# Patient Record
Sex: Male | Born: 1975 | Race: Black or African American | Hispanic: No | Marital: Single | Smoking: Never smoker
Health system: Southern US, Community
[De-identification: ages and names within clinical notes are randomized; demographics above are authoritative.]

## PROBLEM LIST (undated history)

## (undated) DIAGNOSIS — K219 Gastro-esophageal reflux disease without esophagitis: Secondary | ICD-10-CM

## (undated) DIAGNOSIS — R51 Headache: Secondary | ICD-10-CM

## (undated) DIAGNOSIS — B019 Varicella without complication: Secondary | ICD-10-CM

## (undated) DIAGNOSIS — IMO0001 Reserved for inherently not codable concepts without codable children: Secondary | ICD-10-CM

## (undated) DIAGNOSIS — R519 Headache, unspecified: Secondary | ICD-10-CM

## (undated) DIAGNOSIS — R030 Elevated blood-pressure reading, without diagnosis of hypertension: Secondary | ICD-10-CM

## (undated) DIAGNOSIS — J302 Other seasonal allergic rhinitis: Secondary | ICD-10-CM

## (undated) DIAGNOSIS — K921 Melena: Secondary | ICD-10-CM

## (undated) HISTORY — DX: Varicella without complication: B01.9

## (undated) HISTORY — DX: Headache: R51

## (undated) HISTORY — DX: Headache, unspecified: R51.9

## (undated) HISTORY — DX: Other seasonal allergic rhinitis: J30.2

## (undated) HISTORY — DX: Gastro-esophageal reflux disease without esophagitis: K21.9

## (undated) HISTORY — PX: OTHER SURGICAL HISTORY: SHX169

## (undated) HISTORY — DX: Reserved for inherently not codable concepts without codable children: IMO0001

## (undated) HISTORY — DX: Melena: K92.1

## (undated) HISTORY — DX: Elevated blood-pressure reading, without diagnosis of hypertension: R03.0

---

## 2012-12-07 ENCOUNTER — Encounter (HOSPITAL_COMMUNITY): Payer: Self-pay | Admitting: Emergency Medicine

## 2012-12-07 ENCOUNTER — Emergency Department (HOSPITAL_COMMUNITY)
Admission: EM | Admit: 2012-12-07 | Discharge: 2012-12-07 | Disposition: A | Discharge status: Home / Self Care | Payer: BC Managed Care – PPO | Attending: Emergency Medicine | Admitting: Emergency Medicine

## 2012-12-07 ENCOUNTER — Emergency Department (HOSPITAL_COMMUNITY): Payer: BC Managed Care – PPO

## 2012-12-07 DIAGNOSIS — M25559 Pain in unspecified hip: Secondary | ICD-10-CM

## 2012-12-07 MED ORDER — MELOXICAM 15 MG PO TABS: 15.0000 mg | ORAL_TABLET | Freq: Every day | ORAL | Status: DC

## 2012-12-07 MED ORDER — MELOXICAM 7.5 MG PO TABS: 15.0000 mg | ORAL_TABLET | Freq: Every day | ORAL | Status: DC

## 2012-12-07 NOTE — ED Notes (Signed)
Pt presenting to ed with c/o right side hip pain x 1 month pt states he is unsure of injury.

## 2012-12-07 NOTE — ED Notes (Signed)
Patient transported to X-ray 

## 2012-12-10 NOTE — ED Provider Notes (Signed)
History     CSN: [id number]  Arrival date & time 12/07/12  2725   First MD Initiated Contact with Patient 12/07/12 [phone]      Chief Complaint  Patient presents with  . Hip Pain    (Consider location/radiation/quality/duration/timing/severity/associated sxs/prior treatment) HPI Patient with c/o right hip pai x 1 month.  Pain is worse when lying flat or with excessive physical activity ( weigh training, running, playnig basketball.) pain is in front hip and worse with hip extension. Denies abdominal/groin or testicular pain.  Denies dysuria, hematuria or frequency.  History reviewed. No pertinent past medical history.  History reviewed. No pertinent past surgical history.  No family history on file.  History  Substance Use Topics  . Smoking status: Never Smoker   . Smokeless tobacco: Not on file  . Alcohol Use: Yes     Comment: occassionally      Review of Systems Ten systems reviewed and are negative for acute change, except as noted in the HPI.   Allergies  Review of patient's allergies indicates no known allergies.  Home Medications   Current Outpatient Rx  Name  Route  Sig  Dispense  Refill  . MELOXICAM 15 MG PO TABS   Oral   Take 1 tablet (15 mg total) by mouth daily.   10 tablet   0     BP 123/70  Pulse 60  Temp 98.7 F (37.1 C) (Oral)  Resp 17  SpO2 99%  Physical Exam Physical Exam  Nursing note and vitals reviewed. Constitutional: He appears well-developed and well-nourished. No distress.  HENT:  Head: Normocephalic and atraumatic.  Eyes: Conjunctivae normal are normal. No scleral icterus.  Neck: Normal range of motion. Neck supple.  Cardiovascular: Normal rate, regular rhythm and normal heart sounds.   Pulmonary/Chest: Effort normal and breath sounds normal. No respiratory distress.  Abdominal: Soft. There is no tenderness.  Musculoskeletal: He exhibits no edema. A right hip exam was performed. SKIN: intact SWELLING: none WARMTH: no  warmth TENDERNESS: none ROM: full STRENGTH: normal GAIT: normal CREPITUS: no LEG LENGTH DISCREPANCY: none NEUROLOGICAL EXAM: normal VASCULAR EXAM: normal Neurological: He is alert.  Skin: Skin is warm and dry. He is not diaphoretic.  Psychiatric: His behavior is normal.    ED Course  Procedures (including critical care time)  Labs Reviewed - No data to display No results found.   1. Hip pain       MDM  Patient with possible tendinitis of the hip. NO AVN  Or other radiographic abnormlitiy. Patient will be discharged with anitinflammatory and ortho follow up.        Arthor Captain, PA-C 12/10/12 1851

## 2012-12-12 NOTE — ED Provider Notes (Signed)
Medical screening examination/treatment/procedure(s) were performed by non-physician practitioner and as supervising physician I was immediately available for consultation/collaboration.   Nelia Shi, MD 12/12/12 1037

## 2014-07-21 ENCOUNTER — Encounter (HOSPITAL_COMMUNITY): Payer: Self-pay | Admitting: Emergency Medicine

## 2014-07-21 ENCOUNTER — Emergency Department (HOSPITAL_COMMUNITY): Payer: BC Managed Care – PPO

## 2014-07-21 ENCOUNTER — Emergency Department (HOSPITAL_COMMUNITY)
Admission: EM | Admit: 2014-07-21 | Discharge: 2014-07-21 | Disposition: A | Discharge status: Home / Self Care | Payer: BC Managed Care – PPO | Attending: Emergency Medicine | Admitting: Emergency Medicine

## 2014-07-21 DIAGNOSIS — R1013 Epigastric pain: Secondary | ICD-10-CM | POA: Insufficient documentation

## 2014-07-21 DIAGNOSIS — R110 Nausea: Secondary | ICD-10-CM | POA: Diagnosis not present

## 2014-07-21 DIAGNOSIS — R079 Chest pain, unspecified: Secondary | ICD-10-CM | POA: Diagnosis present

## 2014-07-21 DIAGNOSIS — R0602 Shortness of breath: Secondary | ICD-10-CM | POA: Insufficient documentation

## 2014-07-21 LAB — CBC
HEMATOCRIT: 46.4 % (ref 39.0–52.0)
HEMOGLOBIN: 16 g/dL (ref 13.0–17.0)
MCH: 29.9 pg (ref 26.0–34.0)
MCHC: 34.5 g/dL (ref 30.0–36.0)
MCV: 86.6 fL (ref 78.0–100.0)
Platelets: 217 10*3/uL (ref 150–400)
RBC: 5.36 MIL/uL (ref 4.22–5.81)
RDW: 13.3 % (ref 11.5–15.5)
WBC: 6.4 10*3/uL (ref 4.0–10.5)

## 2014-07-21 LAB — BASIC METABOLIC PANEL
Anion gap: 14 (ref 5–15)
BUN: 12 mg/dL (ref 6–23)
CO2: 25 meq/L (ref 19–32)
Calcium: 9.2 mg/dL (ref 8.4–10.5)
Chloride: 101 mEq/L (ref 96–112)
Creatinine, Ser: 0.93 mg/dL (ref 0.50–1.35)
GFR calc Af Amer: >90 mL/min (ref >90)
GLUCOSE: 113 mg/dL — AB (ref 70–99)
POTASSIUM: 4 meq/L (ref 3.7–5.3)
Sodium: 140 mEq/L (ref 137–147)

## 2014-07-21 LAB — I-STAT TROPONIN, ED
TROPONIN I, POC: 0.03 ng/mL (ref 0.00–0.08)
TROPONIN I, POC: 0.05 ng/mL (ref 0.00–0.08)

## 2014-07-21 LAB — PRO B NATRIURETIC PEPTIDE: Pro B Natriuretic peptide (BNP): 13.9 pg/mL (ref 0–125)

## 2014-07-21 MED ORDER — PANTOPRAZOLE SODIUM 20 MG PO TBEC: 40.0000 mg | DELAYED_RELEASE_TABLET | Freq: Every day | ORAL | Status: DC

## 2014-07-21 MED ORDER — GI COCKTAIL ~~LOC~~
30.0000 mL | Freq: Once | ORAL | Status: AC
  Administered 2014-07-21: 30 mL via ORAL
  Filled 2014-07-21: qty 30

## 2014-07-21 MED ORDER — SUCRALFATE 1 G PO TABS: 1.0000 g | ORAL_TABLET | Freq: Four times a day (QID) | ORAL | Status: DC

## 2014-07-21 NOTE — ED Notes (Signed)
Pt. reports pain across chest onset this evening with mild SOB and nausea . No diaphoresis .

## 2014-07-21 NOTE — ED Notes (Signed)
Family at bedside. 

## 2014-07-21 NOTE — ED Provider Notes (Signed)
CSN: [id number]     Arrival date & time 07/21/14  0127 History   First MD Initiated Contact with Patient 07/21/14 0321     Chief Complaint  Patient presents with  . Chest Pain     (Consider location/radiation/quality/duration/timing/severity/associated sxs/prior Treatment) HPI 38 yo male presents to the ER from home with complaint of chest pain waking from sleep around 1 am.  Pain is sharp, extends as a band across his chest.  No diaphoresis, mild nausea and mild sob.  No prior h/o same.  Pain in epigastrium as well.  Denies any medical history, family history. History reviewed. No pertinent past medical history. History reviewed. No pertinent past surgical history. No family history on file. History  Substance Use Topics  . Smoking status: Never Smoker   . Smokeless tobacco: Not on file  . Alcohol Use: Yes     Comment: occassionally    Review of Systems  All other systems reviewed and are negative.     Allergies  Review of patient's allergies indicates no known allergies.  Home Medications   Prior to Admission medications   Not on File   BP 124/81  Pulse 69  Temp(Src) 97.8 F (36.6 C) (Oral)  Resp 20  SpO2 96% Physical Exam  Nursing note and vitals reviewed. Constitutional: He is oriented to person, place, and time. He appears well-developed and well-nourished. No distress.  HENT:  Head: Normocephalic and atraumatic.  Nose: Nose normal.  Mouth/Throat: Oropharynx is clear and moist.  Eyes: Conjunctivae and EOM are normal. Pupils are equal, round, and reactive to light.  Neck: Normal range of motion. Neck supple. No JVD present. No tracheal deviation present. No thyromegaly present.  Cardiovascular: Normal rate, regular rhythm, normal heart sounds and intact distal pulses.  Exam reveals no gallop and no friction rub.   No murmur heard. Pulmonary/Chest: Effort normal and breath sounds normal. No stridor. No respiratory distress. He has no wheezes. He has no rales.  He exhibits no tenderness.  Abdominal: Soft. Bowel sounds are normal. He exhibits no distension and no mass. There is tenderness (mild epigastric tenderness). There is no rebound and no guarding.  Musculoskeletal: Normal range of motion. He exhibits no edema and no tenderness.  Lymphadenopathy:    He has no cervical adenopathy.  Neurological: He is alert and oriented to person, place, and time. He displays normal reflexes. He exhibits normal muscle tone. Coordination normal.  Skin: Skin is warm and dry. No rash noted. No erythema. No pallor.  Psychiatric: He has a normal mood and affect. His behavior is normal. Judgment and thought content normal.    ED Course  Procedures (including critical care time) Labs Review Labs Reviewed  BASIC METABOLIC PANEL - Abnormal; Notable for the following:    Glucose, Bld 113 (*)    All other components within normal limits  CBC  PRO B NATRIURETIC PEPTIDE  I-STAT TROPOININ, ED  Rosezena Sensor, ED    Imaging Review Dg Chest 2 View  07/21/2014   CLINICAL DATA:  Chest pain.  EXAM: CHEST  2 VIEW  COMPARISON:  None.  FINDINGS: The lungs are mildly hypoexpanded but appear grossly clear. There is no evidence of focal opacification, pleural effusion or pneumothorax.  The heart is normal in size; the mediastinal contour is within normal limits. No acute osseous abnormalities are seen.  IMPRESSION: Lungs mildly hypoexpanded but grossly clear.   Electronically Signed   By: Roanna Raider M.D.   On: 07/21/2014 02:57  EKG Interpretation   Date/Time:  Thursday July 21 2014 01:38:40 EDT Ventricular Rate:  72 PR Interval:  166 QRS Duration: 92 QT Interval:  370 QTC Calculation: 405 R Axis:   56 Text Interpretation:  Normal sinus rhythm Normal ECG Confirmed by Alizeh Madril   MD, Quinlin Conant ([id number]) on 07/21/2014 3:33:51 AM      MDM   Final diagnoses:  Chest pain with low risk for cardiac etiology    38 yo male with chest pain.  Heart score is 0.  Pain does  not seem pulmonary or cardiac in nature.  Possible GERD, will give GI cocktail.  Delta trop pending.    Olivia Mackie, MD 07/21/14 [phone]

## 2014-07-21 NOTE — Discharge Instructions (Signed)
Chest Pain Observation It is often hard to give a specific diagnosis for the cause of chest pain. Among other possibilities your symptoms might be caused by inadequate oxygen delivery to your heart (angina). Angina that is not treated or evaluated can lead to a heart attack (myocardial infarction) or death. Blood tests, electrocardiograms, and X-rays may have been done to help determine a possible cause of your chest pain. After evaluation and observation, your health care provider has determined that it is unlikely your pain was caused by an unstable condition that requires hospitalization. However, a full evaluation of your pain may need to be completed, with additional diagnostic testing as directed. It is very important to keep your follow-up appointments. Not keeping your follow-up appointments could result in permanent heart damage, disability, or death. If there is any problem keeping your follow-up appointments, you must call your health care provider. HOME CARE INSTRUCTIONS  Due to the slight chance that your pain could be angina, it is important to follow your health care provider's treatment plan and also maintain a healthy lifestyle:  Maintain or work toward achieving a healthy weight.  Stay physically active and exercise regularly.  Decrease your salt intake.  Eat a balanced, healthy diet. Talk to a dietitian to learn about heart-healthy foods.  Increase your fiber intake by including whole grains, vegetables, fruits, and nuts in your diet.  Avoid situations that cause stress, anger, or depression.  Take medicines as advised by your health care provider. Report any side effects to your health care provider. Do not stop medicines or adjust the dosages on your own.  Quit smoking. Do not use nicotine patches or gum until you check with your health care provider.  Keep your blood pressure, blood sugar, and cholesterol levels within normal limits.  Limit alcohol intake to no more than  1 drink per day for women who are not pregnant and 2 drinks per day for men.  Do not abuse drugs. SEEK IMMEDIATE MEDICAL CARE IF: You have severe chest pain or pressure which may include symptoms such as:  You feel pain or pressure in your arms, neck, jaw, or back.  You have severe back or abdominal pain, feel sick to your stomach (nauseous), or throw up (vomit).  You are sweating profusely.  You are having a fast or irregular heartbeat.  You feel short of breath while at rest.  You notice increasing shortness of breath during rest, sleep, or with activity.  You have chest pain that does not get better after rest or after taking your usual medicine.  You wake from sleep with chest pain.  You are unable to sleep because you cannot breathe.  You develop a frequent cough or you are coughing up blood.  You feel dizzy, faint, or experience extreme fatigue.  You develop severe weakness, dizziness, fainting, or chills. Any of these symptoms may represent a serious problem that is an emergency. Do not wait to see if the symptoms will go away. Call your local emergency services (911 in the U.S.). Do not drive yourself to the hospital. MAKE SURE YOU:  Understand these instructions.  Will watch your condition.  Will get help right away if you are not doing well or get worse. Document Released: 11/23/2010 Document Revised: 10/26/2013 Document Reviewed: 04/22/2013 [facility] Patient Information 2015 [place], [place]. This information is not intended to replace advice given to you by your health care provider. Make sure you discuss any questions you have with your health care provider.  Pain of Unknown Etiology (Pain Without a Known Cause) You have come to your caregiver because of pain. Pain can occur in any part of the body. Often there is not a definite cause. If your laboratory (blood or urine) work was normal and X-rays or other studies were normal, your caregiver may treat you without  knowing the cause of the pain. An example of this is the headache. Most headaches are diagnosed by taking a history. This means your caregiver asks you questions about your headaches. Your caregiver determines a treatment based on your answers. Usually testing done for headaches is normal. Often testing is not done unless there is no response to medications. Regardless of where your pain is located today, you can be given medications to make you comfortable. If no physical cause of pain can be found, most cases of pain will gradually leave as suddenly as they came.  If you have a painful condition and no reason can be found for the pain, it is important that you follow up with your caregiver. If the pain becomes worse or does not go away, it may be necessary to repeat tests and look further for a possible cause.  Only take over-the-counter or prescription medicines for pain, discomfort, or fever as directed by your caregiver.  For the protection of your privacy, test results cannot be given over the phone. Make sure you receive the results of your test. Ask how these results are to be obtained if you have not been informed. It is your responsibility to obtain your test results.  You may continue all activities unless the activities cause more pain. When the pain lessens, it is important to gradually resume normal activities. Resume activities by beginning slowly and gradually increasing the intensity and duration of the activities or exercise. During periods of severe pain, bed rest may be helpful. Lie or sit in any position that is comfortable.  Ice used for acute (sudden) conditions may be effective. Use a large plastic bag filled with ice and wrapped in a towel. This may provide pain relief.  See your caregiver for continued problems. Your caregiver can help or refer you for exercises or physical therapy if necessary. If you were given medications for your condition, do not drive, operate machinery or  power tools, or sign legal documents for 24 hours. Do not drink alcohol, take sleeping pills, or take other medications that may interfere with treatment. See your caregiver immediately if you have pain that is becoming worse and not relieved by medications. Document Released: 07/16/2001 Document Revised: 08/11/2013 Document Reviewed: 10/21/2005 [facility] Patient Information 2015 [place], [place]. This information is not intended to replace advice given to you by your health care provider. Make sure you discuss any questions you have with your health care provider.   Emergency Department Resource Guide 1) Find a Doctor and Pay Out of Pocket Although you won't have to find out who is covered by your insurance plan, it is a good idea to ask around and get recommendations. You will then need to call the office and see if the doctor you have chosen will accept you as a new patient and what types of options they offer for patients who are self-pay. Some doctors offer discounts or will set up payment plans for their patients who do not have insurance, but you will need to ask so you aren't surprised when you get to your appointment.  2) Contact Your Local Health Department Not all health departments have doctors  that can see patients for sick visits, but many do, so it is worth a call to see if yours does. If you don't know where your local health department is, you can check in your phone book. The CDC also has a tool to help you locate your state's health department, and many state websites also have listings of all of their local health departments.  3) Find a Walk-in Clinic If your illness is not likely to be very severe or complicated, you may want to try a walk in clinic. These are popping up all over the country in pharmacies, drugstores, and shopping centers. They're usually staffed by nurse practitioners or physician assistants that have been trained to treat common illnesses and complaints. They're  usually fairly quick and inexpensive. However, if you have serious medical issues or chronic medical problems, these are probably not your best option.  No Primary Care Doctor: - Call Health Connect at  [phone] - they can help you locate a primary care doctor that  accepts your insurance, provides certain services, etc. - Physician Referral Service- [phone]  Chronic Pain Problems: Organization         Address  Phone   Notes  [facility]  [phone] Patients need to be referred by their primary care doctor.   Medication Assistance: Organization         Address  Phone   Notes  [facility] [address] [place] [place], [place] [address] [phone] --Must be a resident of [facility] -- Must have NO insurance coverage whatsoever (no Medicaid/ Medicare, etc.) -- The pt. MUST have a primary care doctor that directs their care regularly and follows them in the community   [facility]  [phone]   [employer]  [phone]    Agencies that provide inexpensive medical care: Organization         Address  Phone   Notes  [facility]  [phone]   [facility]    [phone]   [facility] [address] [place], [place] [address] [phone]   [facility] [address] [place] [place] [phone]   Planned Parenthood    [phone]   [facility]    [phone]   [facility]  [address], [place] Phone:  [phone], Fax:  [phone] Hours of Operation:  9 am - 6 pm, M-F.  Also accepts Medicaid/Medicare and self-pay.  [facility]  [address], [place] Phone: [phone], Fax: [phone]. Hours of Operation:  8:30 am - 5:30 pm, M-F.  Also accepts Medicaid and self-pay.  [facility] [place] [address], [place] Phone:  [phone]   [facility] [address] Natasha Bence [place], [place] [phone], Ext. [phone] Mondays & Thursdays: 7-9 AM.  First 15 patients are seen on a first come, first serve basis.    Medicaid-accepting [facility] Providers:  Organization         Address  Phone   Notes  [facility] [address], [place] [phone] Also accepts self-pay patients.  [facility] [address] Laurell Josephs [place], [place]  [phone]   [facility] [address], [place] [phone]   [facility] [address]  [place], [place] [phone]   Renaye Rakers [address], [place]   [phone] Only accepts [place] Access [place] patients after they have their name applied to their card.   Self-Pay (no insurance) in [facility]:  Organization         Address  Phone   Notes  Sickle Cell Patients, [facility] [address] [place] [address] [place], [place] [phone]   [facility] [address] [place], [place] [phone]   [facility] [place]  [address], [place] [phone]   [facility]/Dr. Osei-Bonsu  [address] [place] or [address], [place] [phone] Phone number for both [place] and [place] locations is the same.  [facility] [address], [place] [phone]   [facility] [address] [place] or [address] [phone]   [facility] [address], [place] [phone], phone; [phone], fax Sees patients 1st and 3rd Saturday of every month.  Must not qualify for public or private insurance (i.e. Medicaid, Medicare, [place] Health Choice, Veterans' Benefits)  Household income should be no more than 200% of the poverty level The clinic cannot treat  you if you are pregnant or think you are pregnant  Sexually transmitted diseases are not treated at the clinic.    Dental Care: Organization         Address  Phone  Notes  [facility] [address] [place], [place] [phone] Accepts children up to age 27 who are enrolled in [place] or [place] Health Choice; pregnant women with a Medicaid card; and children who have applied for Medicaid or [place] Health Choice, but were declined, whose parents can pay a reduced fee at time of service.  [facility]  [address], [place] [phone] Accepts children up to age 97 who are enrolled in [place] or [place] Health Choice; pregnant women with a Medicaid card; and children who have applied for Medicaid or  Health Choice, but were declined, whose parents can pay a reduced fee at time of service.  [facility]  [address] [place], [place] [phone] Patients are seen by appointment only. Walk-ins are not accepted. [facility] will see patients 70 years of age and older. Monday - Tuesday (8am-5pm) Most Wednesdays (8:30-5pm) $30 per visit, cash only  [facility] Adult Dental Access PRO[facility]  [address], [facility] [phone] Patients are seen by appointment only. Walk-ins are not accepted. [facility] will see patients 60 years of age and older. One Wednesday Evening (Monthly: Volunteer Based).  $30 per visit, cash only  [employer]  [phone] for adults; Children under age 66, call Graduate Pediatric Dentistry at [phone]. Children aged 33-14, please call [phone] to request a pediatric application.  Dental services are provided in all areas of dental care including fillings, crowns and bridges, complete and partial dentures, implants, gum treatment, root canals, and extractions. Preventive care is also provided. Treatment  is provided to both adults and children. Patients are selected via a lottery and there is often a waiting list.   [facility] [address], [place]  [phone] [email or web address]   [facility] [address] [place], [place] [phone], Ext. [phone] Second and Fourth Thursday of each month, opens at 6:30 AM; Clinic ends at 9 AM.  Patients are seen on a first-come first-served basis, and a limited number are seen during each clinic.   [facility]  [address] Ether Griffins [place], [place] [phone]   Eligibility Requirements You must have lived in [place], [place], or [place] counties for at least the last three months.   You cannot be eligible for state or federal sponsored [place], including [employer], [place], or [employer].   You generally cannot be eligible for healthcare insurance through your employer.    How to apply: Eligibility screenings are held every Tuesday and Wednesday afternoon from 1:00 pm until 4:00 pm. You do not need an appointment for the interview!  [facility] [address] [place], [place] [phone]   [facility]  [phone]   [facility]  [phone]   [facility] Health Department  [phone]    Behavioral Health Resources in the Community: Intensive Outpatient Programs Organization         Address  Phone  Notes  [facility] [address] [place], [place] [phone]   [facility] Outpatient [address] [place] [address] [place], [place] [phone]   [facility] [address], [place], [place]  [phone]   [facility] Mental Health [address]  [place], [place] [phone] or [phone]   Substance Abuse Resources Organization         Address  Phone  Notes  [facility]  [phone]   [facility]  [phone]   The  [place]  [phone]   Floydene Flock  [phone]   Residential & Outpatient Substance Abuse Program  [phone]   Psychological Services Organization         Address  Phone  Notes  [facility] Behavioral Health  [phone]   [facility]  [phone]   [facility] Mental Health [address], [place] [phone] or [phone]    Mobile Crisis Teams Organization         Address  Phone  Notes  [facility], Mobile Crisis Care Unit  [phone]   [facility]  [address] [place], [place] [phone]   Doristine Locks [address] [place] [phone]    Self-Help/Support Groups Organization         Address  Phone             Notes  Mental Health Assoc. of  - variety of support groups  336- I7437963 Call for more information  Narcotics Anonymous (NA), [facility] [address], [employer] [place]  2 meetings at this location   Statistician         Address  Phone  Notes  [facility] [address],    [place]  [phone]   [facility]  [address] [place] [address], [place], [place] [phone]   [facility] [address] [place], [place] [phone] Admissions: 8am-3pm M-F  [facility] [address]    [place], [place] [phone]   [facility] [address] Starling Manns [place], [place] [phone]   [facility] [address],  [place], [place] [phone]   [facility] - Intensive Outpatient [address] Laurell Joseph[address], [place], [place] [phone]  [facility] ([facility]iction [facility] [address] [place]  [place], [place] [phone] or [phone]   [facility] ([facility]) [address], [place], [place] [phone] Accepts Medicaid  [facility] [address]  [place] [phone] Substance Abuse/Addiction Treatment     [facility] Organization         Address  Phone  Notes  [facility]  [phone]   Angie Fava, PhD [address] [place], [place]   [phone] or [phone]   [facility]   [address] [place], [place] [phone]   [facility] [address], [place], [place] [phone] Insurance/Medicaid/sponsorship through [facility] and Families [address]                                    [place], [place] [phone] Therapy/tele-psych/case  [facility] [address], [place] [phone]    Dr. Lolly Mustache  [phone]   [facility] [place]  [employer] [facility] Dept. 1) [address] [place] 2) [address] [place] 3)  [address], [place] [phone]  [phone]   [facility] Child Abuse Hotline [phone] or [phone] (After Hours)

## 2014-08-03 ENCOUNTER — Encounter: Payer: Self-pay | Admitting: Family Medicine

## 2014-08-03 ENCOUNTER — Ambulatory Visit (INDEPENDENT_AMBULATORY_CARE_PROVIDER_SITE_OTHER): Payer: BC Managed Care – PPO | Admitting: Family Medicine

## 2014-08-03 VITALS — BP 114/82 | HR 60 | Temp 98.2°F | Ht 69.0 in | Wt 225.0 lb

## 2014-08-03 DIAGNOSIS — R519 Headache, unspecified: Secondary | ICD-10-CM | POA: Insufficient documentation

## 2014-08-03 DIAGNOSIS — B019 Varicella without complication: Secondary | ICD-10-CM | POA: Insufficient documentation

## 2014-08-03 DIAGNOSIS — K921 Melena: Secondary | ICD-10-CM | POA: Insufficient documentation

## 2014-08-03 DIAGNOSIS — Z202 Contact with and (suspected) exposure to infections with a predominantly sexual mode of transmission: Secondary | ICD-10-CM

## 2014-08-03 DIAGNOSIS — R51 Headache: Secondary | ICD-10-CM | POA: Insufficient documentation

## 2014-08-03 DIAGNOSIS — K219 Gastro-esophageal reflux disease without esophagitis: Secondary | ICD-10-CM

## 2014-08-03 HISTORY — DX: Varicella without complication: B01.9

## 2014-08-03 NOTE — Progress Notes (Signed)
Maxwell ConchStephen Hunter, MD Phone: [phone]  Subjective:  Patient presents today to establish care as new patient. Chief complaint-noted.   Patient formerly cared for in [place] and got yearly physicals. He has a few concerns including chest pain on 9/13 that woke him from sleep. He had normal workup in ED and has not had recurrent symptoms-heart score 0 and thought likely due to GERD. No shortness of breath, nausea, vomiting, diaphoresis. Patient thinks he has hemorrhoids as he has occasional rectal bleeding with straining. Never been evaluated. Did have exposure to genital warts many years ago but no history STD since that time with normal testing. Patient is interested in losing weight and was around 185 in college. We discussed goals for weight and tactics to get there.   The following were reviewed and entered/updated in epic: Past Medical History  Diagnosis Date  . Blood in stool   . Headache     every other month. ? concussion related from HS football  . GERD (gastroesophageal reflux disease)     1x chest pain  . Seasonal allergies     as child  . Elevated blood pressure     once when in ED per patient  . Chicken pox 08/03/2014   Patient Active Problem List   Diagnosis Date Noted  . Blood in stool 08/03/2014    Priority: High  . GERD (gastroesophageal reflux disease) 08/03/2014    Priority: Medium  . Headache(784.0) 08/03/2014    Priority: Low  . Exposure to STD 08/03/2014    Priority: Low   Past Surgical History  Procedure Laterality Date  . None      Family History  Problem Relation Age of Onset  . Alcoholism Father   . Arthritis      grandparents  . Breast cancer Mother   . Hyperlipidemia Father   . Hypertension Father   . Sudden death Brother     40s-very active guy-not clear cause    Medications- reviewed and updated. No current medications.   Allergies-reviewed and updated No Known Allergies  History   Social History  . Marital Status: Single   Spouse Name: N/A    Number of Children: N/A  . Years of Education: N/A   Social History Main Topics  . Smoking status: Never Smoker   . Smokeless tobacco: None  . Alcohol Use: 2.5 oz/week    5 drink(s) per week     Comment: occassionally  . Drug Use: No  . Sexual Activity: None   Other Topics Concern  . None   Social History Narrative   Single. 1 daughter Maxwell Kerr who is 1513 and lives in [place] as of 2013. Drives down to see her weekly.    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 Oropharynx normal. TM normal.  Eyes: no scleral icterus, sclera and conjunctiva normal Neck: no thyromegaly, no lymphadenopathy CV: RRR no murmurs rubs or gallops Lungs: CTAB no crackles, wheeze, rhonchi Abdomen: soft/nontender/nondistended/normal bowel sounds. No rebound or guarding.  Ext: no edema, 2+ PT pulses,  Skin: warm, dry,  no rashes Neuro: 5/5 strength upper and lower extremities, 2+ reflexes  Assessment/Plan:  Blood in stool Likely hemorrhoid related. Present for several years. Consider rectal, hemoccult, anoscopy? at next visit. Consider GI referral though no history of colon cancer in family.   GERD (gastroesophageal reflux disease) 1x episode provoking ED visit. Not clear if truly GERD but did seem to respond to GI cocktail. Could consider long term PPI trial if recurrent. No stress testing at this time   Exposure to STD Patient desires repeat STD testing at physical. Will plan on rectal and GU exam at that time.   obtain records for further information.

## 2014-08-03 NOTE — Assessment & Plan Note (Signed)
1x episode provoking ED visit. Not clear if truly GERD but did seem to respond to GI cocktail. Could consider long term PPI trial if recurrent. No stress testing at this time

## 2014-08-03 NOTE — Patient Instructions (Addendum)
Things look great today!   Need to get records-stop at front desk  See me back in December or January for physical (will do rectal exam and genital exam at that time)  Target weight 190-200 at least  My 5 to Fitness!  5: fruits and vegetables per day (work on 9 per day if you are at 5) 4: exercise 4-5 times per week for at least 30 minutes (walking counts!) 3: meals per day (don't skip breakfast!) 2: habits to quit -smoking -excess alcohol use (men >2 beer/day; women >1beer/day) 1: sweet per day (2 cookies, 1 small cup of ice cream, 12 oz soda)  These are general tips for healthy living. Try to start with 1 or 2 habit TODAY and make it a part of your life for several months.   Once you have 1 or 2 habits down for several months, try to begin working on your next healthy habit. With every single step you take, you will be leading a healthier lifestyle!

## 2014-08-03 NOTE — Assessment & Plan Note (Signed)
Likely hemorrhoid related. Present for several years. Consider rectal, hemoccult, anoscopy? at next visit. Consider GI referral though no history of colon cancer in family.

## 2014-08-03 NOTE — Assessment & Plan Note (Signed)
Patient desires repeat STD testing at physical. Will plan on rectal and GU exam at that time.

## 2015-01-03 ENCOUNTER — Other Ambulatory Visit (INDEPENDENT_AMBULATORY_CARE_PROVIDER_SITE_OTHER): Payer: BLUE CROSS/BLUE SHIELD

## 2015-01-03 DIAGNOSIS — Z113 Encounter for screening for infections with a predominantly sexual mode of transmission: Secondary | ICD-10-CM

## 2015-01-03 DIAGNOSIS — Z0000 Encounter for general adult medical examination without abnormal findings: Secondary | ICD-10-CM

## 2015-01-03 LAB — POCT URINALYSIS DIPSTICK
Bilirubin, UA: NEGATIVE
Glucose, UA: NEGATIVE
Ketones, UA: NEGATIVE
Leukocytes, UA: NEGATIVE
NITRITE UA: NEGATIVE
PH UA: 5.5
Protein, UA: NEGATIVE
RBC UA: NEGATIVE
Spec Grav, UA: 1.025
UROBILINOGEN UA: 1

## 2015-01-03 LAB — CBC WITH DIFFERENTIAL/PLATELET
BASOS ABS: 0 10*3/uL (ref 0.0–0.1)
BASOS PCT: 0.1 % (ref 0.0–3.0)
EOS PCT: 0.7 % (ref 0.0–5.0)
Eosinophils Absolute: 0.1 10*3/uL (ref 0.0–0.7)
HEMATOCRIT: 47.1 % (ref 39.0–52.0)
Hemoglobin: 15.9 g/dL (ref 13.0–17.0)
LYMPHS ABS: 1.4 10*3/uL (ref 0.7–4.0)
LYMPHS PCT: 15.9 % (ref 12.0–46.0)
MCHC: 33.7 g/dL (ref 30.0–36.0)
MCV: 88.7 fl (ref 78.0–100.0)
MONOS PCT: 9 % (ref 3.0–12.0)
Monocytes Absolute: 0.8 10*3/uL (ref 0.1–1.0)
NEUTROS ABS: 6.5 10*3/uL (ref 1.4–7.7)
NEUTROS PCT: 74.3 % (ref 43.0–77.0)
Platelets: 230 10*3/uL (ref 150.0–400.0)
RBC: 5.31 Mil/uL (ref 4.22–5.81)
RDW: 14.1 % (ref 11.5–15.5)
WBC: 8.7 10*3/uL (ref 4.0–10.5)

## 2015-01-03 LAB — LIPID PANEL
CHOLESTEROL: 248 mg/dL — AB (ref 0–200)
HDL: 48 mg/dL (ref >39.00)
LDL CALC: 168 mg/dL — AB (ref 0–99)
NonHDL: 200
Total CHOL/HDL Ratio: 5
Triglycerides: 162 mg/dL — ABNORMAL HIGH (ref 0.0–149.0)
VLDL: 32.4 mg/dL (ref 0.0–40.0)

## 2015-01-03 LAB — BASIC METABOLIC PANEL
BUN: 13 mg/dL (ref 6–23)
CALCIUM: 9.6 mg/dL (ref 8.4–10.5)
CHLORIDE: 106 meq/L (ref 96–112)
CO2: 28 mEq/L (ref 19–32)
Creatinine, Ser: 0.97 mg/dL (ref 0.40–1.50)
GFR: 112.24 mL/min (ref >60.00)
GLUCOSE: 100 mg/dL — AB (ref 70–99)
Potassium: 4.1 mEq/L (ref 3.5–5.1)
SODIUM: 141 meq/L (ref 135–145)

## 2015-01-03 LAB — HEPATIC FUNCTION PANEL
ALT: 18 U/L (ref 0–53)
AST: 17 U/L (ref 0–37)
Albumin: 4.4 g/dL (ref 3.5–5.2)
Alkaline Phosphatase: 94 U/L (ref 39–117)
BILIRUBIN TOTAL: 1.1 mg/dL (ref 0.2–1.2)
Bilirubin, Direct: 0.2 mg/dL (ref 0.0–0.3)
Total Protein: 7.4 g/dL (ref 6.0–8.3)

## 2015-01-03 LAB — RPR

## 2015-01-03 LAB — TSH: TSH: 2.78 u[IU]/mL (ref 0.35–4.50)

## 2015-01-04 LAB — HIV ANTIBODY (ROUTINE TESTING W REFLEX): HIV: NONREACTIVE

## 2015-01-10 ENCOUNTER — Ambulatory Visit (INDEPENDENT_AMBULATORY_CARE_PROVIDER_SITE_OTHER): Payer: BLUE CROSS/BLUE SHIELD | Admitting: Family Medicine

## 2015-01-10 ENCOUNTER — Encounter: Payer: Self-pay | Admitting: Family Medicine

## 2015-01-10 VITALS — BP 110/60 | HR 76 | Temp 98.5°F | Ht 69.0 in | Wt 228.0 lb

## 2015-01-10 DIAGNOSIS — IMO0001 Reserved for inherently not codable concepts without codable children: Secondary | ICD-10-CM

## 2015-01-10 DIAGNOSIS — E785 Hyperlipidemia, unspecified: Secondary | ICD-10-CM

## 2015-01-10 DIAGNOSIS — Z23 Encounter for immunization: Secondary | ICD-10-CM

## 2015-01-10 DIAGNOSIS — K219 Gastro-esophageal reflux disease without esophagitis: Secondary | ICD-10-CM

## 2015-01-10 DIAGNOSIS — Z0000 Encounter for general adult medical examination without abnormal findings: Secondary | ICD-10-CM

## 2015-01-10 DIAGNOSIS — K921 Melena: Secondary | ICD-10-CM

## 2015-01-10 DIAGNOSIS — R739 Hyperglycemia, unspecified: Secondary | ICD-10-CM

## 2015-01-10 LAB — POC HEMOCCULT BLD/STL (OFFICE/1-CARD/DIAGNOSTIC)
Fecal Occult Blood, POC: NEGATIVE
OCCULT BLOOD DATE: 30816

## 2015-01-10 NOTE — Assessment & Plan Note (Signed)
No recurrence of pain that led to ED visit and relieved by GI cocktail.

## 2015-01-10 NOTE — Assessment & Plan Note (Signed)
Discussed weight goal <200. Fasting CBG elevated placing at risk for diabetes.

## 2015-01-10 NOTE — Assessment & Plan Note (Addendum)
Likely hemorrhoid related. At CPE hemocult negative and rectal exam unremarkable-visualization without hemorrhoids. Declines anoscopy. Discussed stool cards at least every other year if issue persists. Consider colonoscopy-fortunately no family history (does not rule out but reassuring)

## 2015-01-10 NOTE — Patient Instructions (Signed)
Tdap today  Goal weight 190-200. I like your exercise plan. Tips to help below  No blood in stool detected today. Keep an eye on this and if it becomes more regular let me know  My 5 to Fitness!  5: fruits and vegetables per day (work on 9 per day if you are at 5) 4: exercise 4-5 times per week for at least 30 minutes (walking counts!) 3: meals per day (don't skip breakfast!) 2: habits to quit -smoking -excess alcohol use (men >2 beer/day; women >1beer/day) 1: sweet per day (2 cookies, 1 small cup of ice cream, 12 oz soda)  These are general tips for healthy living. Try to start with 1 or 2 habit TODAY and make it a part of your life for several months.   Once you have 1 or 2 habits down for several months, try to begin working on your next healthy habit. With every single step you take, you will be leading a healthier lifestyle!

## 2015-01-10 NOTE — Progress Notes (Signed)
Maxwell Conch, MD Phone: [phone]  Subjective:  Patient presents today for their annual physical. Chief complaint-noted.   Continues to have bleeding with straining perhaps every 3-6 months. Just on the toilet paper.  Target weigh 190-200 but weight up 3 lbs. Discussed ways to improve this Starting to exercise more. Basketball 2 days a week. Mornings exercise a few days a week.   Eats reasonably well.   ROS- full  review of systems was completed and negative except for: blood on toilet paper as above, occasional headaches, he has not had any further chest pain issues as seen in ED for.   The following were reviewed and entered/updated in epic: Past Medical History  Diagnosis Date  . Blood in stool   . Headache     every other month. ? concussion related from HS football  . GERD (gastroesophageal reflux disease)     1x chest pain  . Seasonal allergies     as child  . Elevated blood pressure     once when in ED per patient  . Chicken pox 08/03/2014   Patient Active Problem List   Diagnosis Date Noted  . Blood in stool 08/03/2014    Priority: High  . Hyperlipidemia 01/10/2015    Priority: Medium  . Hyperglycemia 01/10/2015    Priority: Medium  . Headache(784.0) 08/03/2014    Priority: Low  . GERD (gastroesophageal reflux disease) 08/03/2014    Priority: Low  . Exposure to STD 08/03/2014    Priority: Low   Past Surgical History  Procedure Laterality Date  . None      Family History  Problem Relation Age of Onset  . Alcoholism Father   . Arthritis      grandparents  . Breast cancer Mother   . Hyperlipidemia Father   . Hypertension Father   . Sudden death Brother     40s-very active guy-not clear cause    Medications- none  Allergies-reviewed and updated No Known Allergies  History   Social History  . Marital Status: Single    Spouse Name: N/A  . Number of Children: N/A  . Years of Education: N/A   Social History Main Topics  . Smoking status:  Never Smoker   . Smokeless tobacco: Not on file  . Alcohol Use: 2.5 oz/week    5 drink(s) per week     Comment: occassionally  . Drug Use: No  . Sexual Activity: Not on file   Other Topics Concern  . None   Social History Narrative   Single. 1 daughter Charlsie Quest who is 20 and lives in [place] as of 2013. Drives down to see her weekly.    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 Oropharynx normal Neck: no thyromegaly CV: RRR no murmurs rubs or gallops Lungs: CTAB no crackles, wheeze, rhonchi Abdomen: soft/nontender/nondistended/normal bowel sounds. No rebound or guarding.  GU: hypopigmented raised/scarred area (area of previous cryo for genital wart). Has 1-78mm lesion/papule on mid portion of penis shaft that is flesh colored (has been present since before any sexual activity) Rectal: smooth non enlarged prostate, did not palpate obvious internal hemorrhoids (  Declines anoscopy) Ext: no edema, 2+ PT pulses Skin: warm, dry, no rash Neuro: grossly normal, moves all extremities, PERRLA   hemocult negative  Assessment/Plan:  39 y.o. male presenting for annual physical.  Health Maintenance counseling: 1. Anticipatory guidance: Patient counseled regarding regular dental exams, wearing seatbelts  2. Risk factor reduction:  Advised patient of need for regular exercise and diet rich and fruits and vegetables to reduce risk of heart attack and stroke.  3. Immunizations/screenings/ancillary studies-Tdap today 4. Reviewed bloodwork showing at risk for diabetes and elevated lipids though would not advise statin at age.  Blood in stool Likely hemorrhoid  related. At CPE hemocult negative and rectal exam unremarkable-visualization without hemorrhoids. Declines anoscopy. Discussed stool cards at least every other year if issue persists. Consider colonoscopy-fortunately no family history (does not rule out but reassuring)   Hyperlipidemia No planned primary prevention before age 39 with statin. Needs diet/exercise changes. Lab Results  Component Value Date   CHOL 248* 01/03/2015   HDL 48.00 01/03/2015   LDLCALC 168* 01/03/2015   TRIG 162.0* 01/03/2015   CHOLHDL 5 01/03/2015      GERD (gastroesophageal reflux disease) No recurrence of pain that led to ED visit and relieved by GI cocktail.    Hyperglycemia Discussed weight goal <200. Fasting CBG elevated placing at risk for diabetes.    1 year follow up.   Orders Placed This Encounter  Procedures  . Tdap vaccine greater than or equal to 7yo IM   Results for orders placed or performed in visit on 01/10/15 (from the past 24 hour(s))  POC Hemoccult Bld/Stl (1-Cd Office Dx)     Status: None   Collection Time: 01/10/15 10:54 AM  Result Value Ref Range   Card #1 Date [id number]    Fecal Occult Blood, POC Negative

## 2015-01-10 NOTE — Assessment & Plan Note (Signed)
No planned primary prevention before age 39 with statin. Needs diet/exercise changes. Lab Results  Component Value Date   CHOL 248* 01/03/2015   HDL 48.00 01/03/2015   LDLCALC 168* 01/03/2015   TRIG 162.0* 01/03/2015   CHOLHDL 5 01/03/2015

## 2015-02-01 IMAGING — CR DG CHEST 2V
2 series · 2 of 2 positions shown · non-contrast
Comparison: None.

CLINICAL DATA: Chest pain.

EXAM:
CHEST  2 VIEW

[w chest pa]
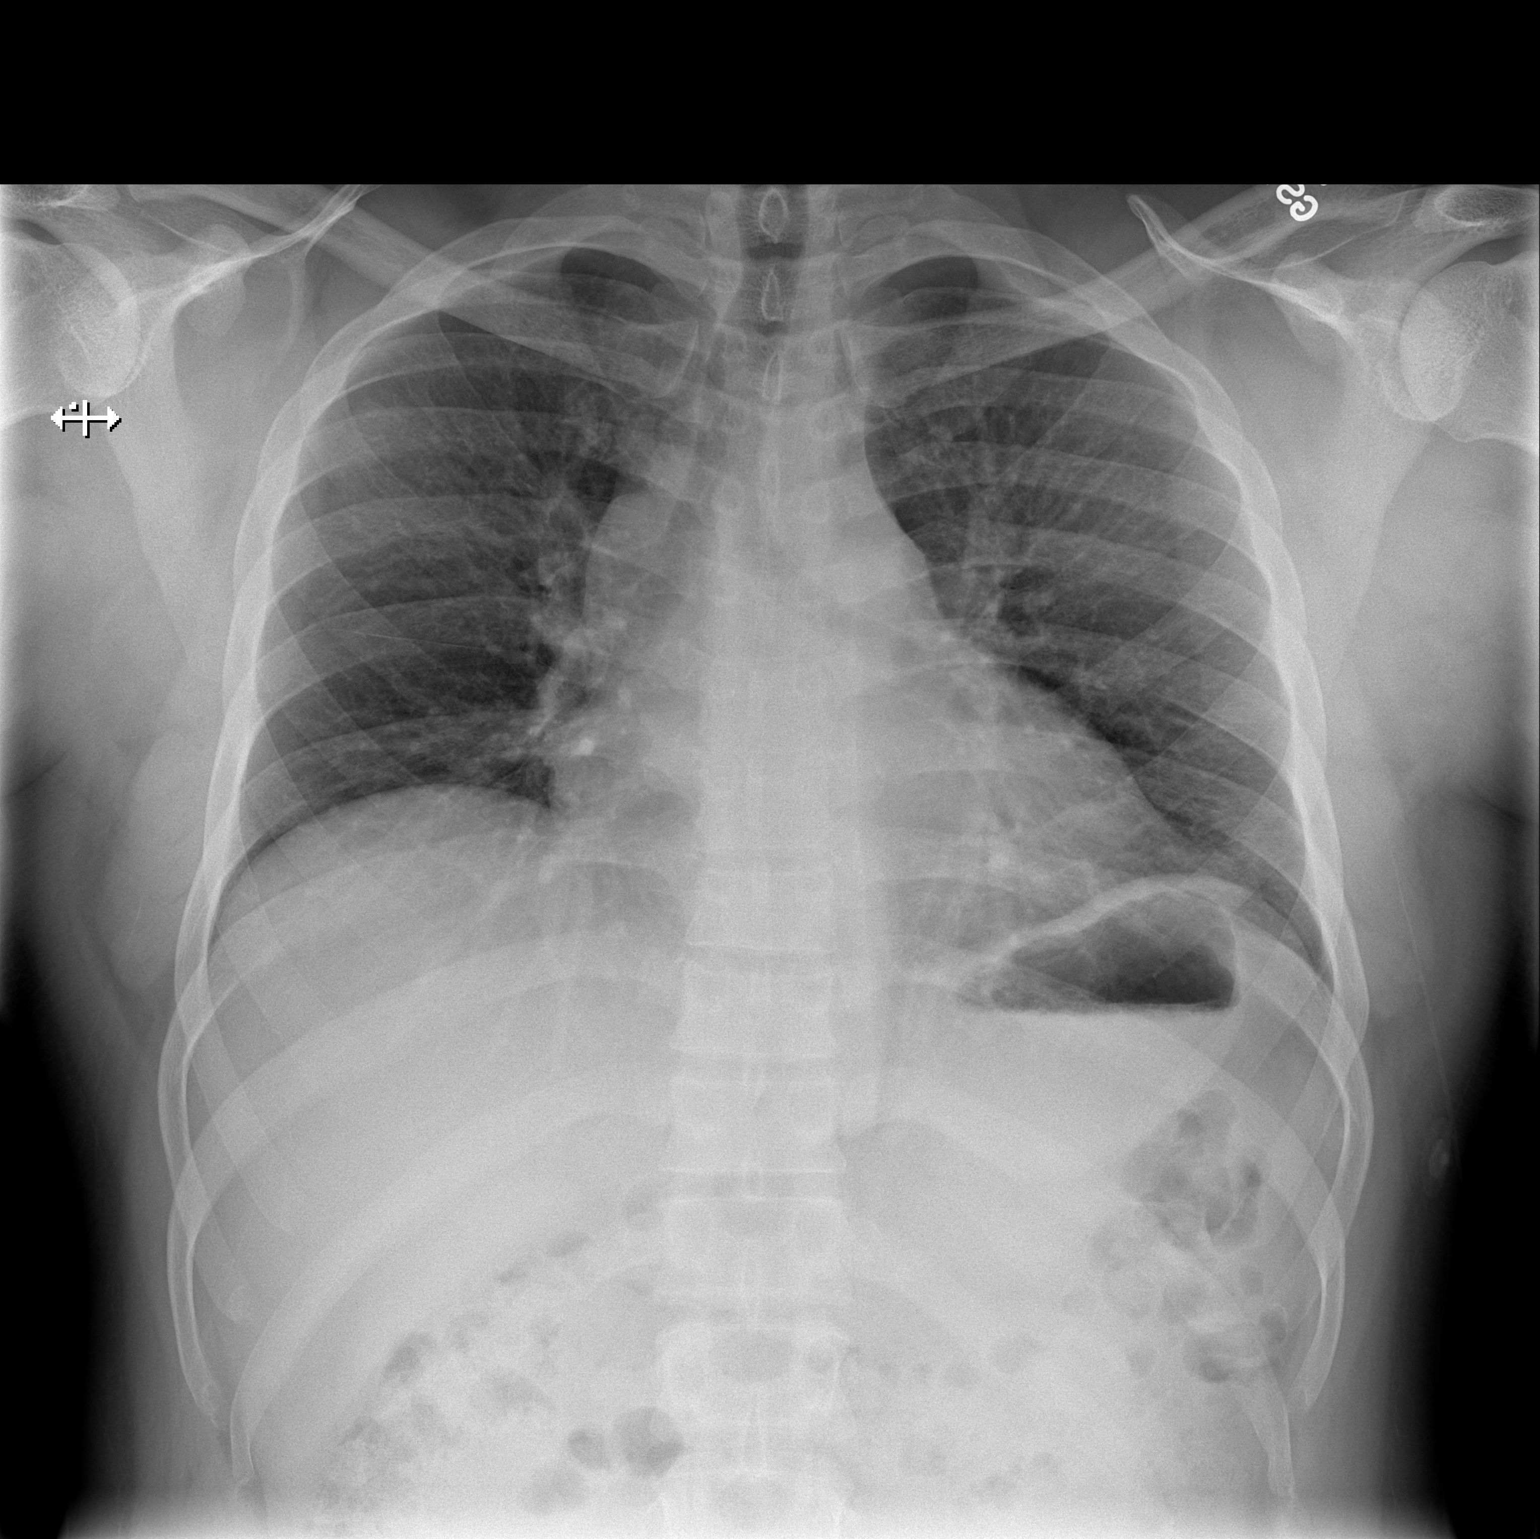

[w chest lat]
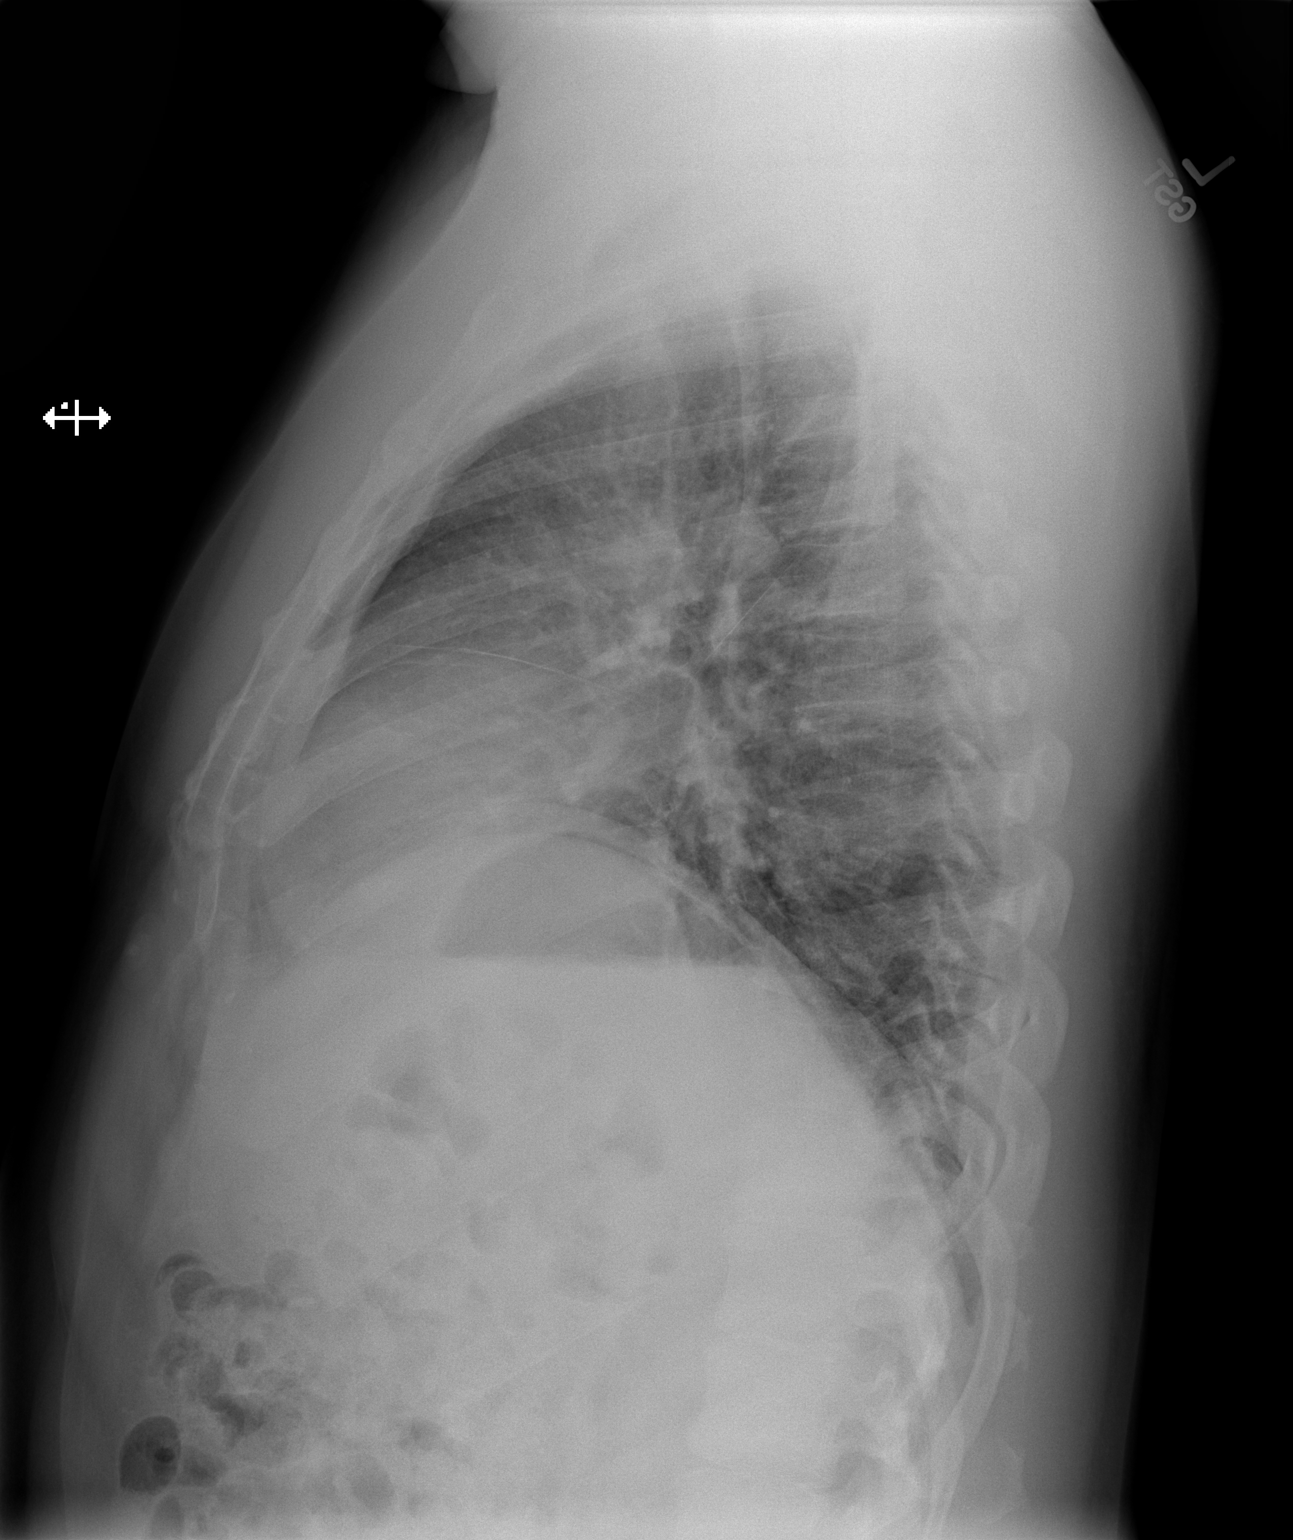

[2 of 2 positions shown; findings below may reference images not displayed]

FINDINGS: The lungs are mildly hypoexpanded but appear grossly clear. There is
no evidence of focal opacification, pleural effusion or
pneumothorax.

The heart is normal in size; the mediastinal contour is within
normal limits. No acute osseous abnormalities are seen.
IMPRESSION: Lungs mildly hypoexpanded but grossly clear.

## 2015-04-04 ENCOUNTER — Encounter: Payer: Self-pay | Admitting: Family Medicine

## 2015-04-04 ENCOUNTER — Ambulatory Visit (INDEPENDENT_AMBULATORY_CARE_PROVIDER_SITE_OTHER): Payer: BLUE CROSS/BLUE SHIELD | Admitting: Family Medicine

## 2015-04-04 VITALS — BP 92/60 | HR 68 | Temp 97.5°F | Wt 237.0 lb

## 2015-04-04 DIAGNOSIS — B354 Tinea corporis: Secondary | ICD-10-CM

## 2015-04-04 DIAGNOSIS — R21 Rash and other nonspecific skin eruption: Secondary | ICD-10-CM

## 2015-04-04 MED ORDER — TRIAMCINOLONE ACETONIDE 0.1 % EX CREA: 1.0000 "application " | TOPICAL_CREAM | Freq: Two times a day (BID) | CUTANEOUS | Status: DC

## 2015-04-04 MED ORDER — CLOTRIMAZOLE 1 % EX CREA: 1.0000 "application " | TOPICAL_CREAM | Freq: Two times a day (BID) | CUTANEOUS | Status: DC

## 2015-04-04 NOTE — Patient Instructions (Addendum)
Rash  Could be due to a contact  But on second look- there are at least 5 lesions that have a raised border and are circular in appearance  Clotrimazole twice a day for a minimum a week but up to 3 weeks. If does not resolve by 3 weeks, give us a call. Also if were to continue to worsen after a week call us.   Do not pick up the triamcinolone

## 2015-04-04 NOTE — Progress Notes (Signed)
Tana ConchStephen Jewell Haught, MD  Subjective:  Maxwell SladeKarlos Kerr is a 39 y.o. year old very pleasant male patient who presents with:  Rash about a month. Itchy bumps on both forearms, maybe on the back. Spots originally came and went and now they tend to persist. Thought could be heat related. Has tried cortisone cream- helps slightly with the itch. Only new exposure is being outdoors for softball for daughter. Wears short sleeves at work, not on upper arms. Tried a new detergent-Tide and usually used gain. No new soaps, fabric softeners. Has started C4 supplement for working out- 2-3 weeks ago, rash present before that.   ROS-not ill appearing, no fever/chills. No new medications. Not immunocompromised. No mucus membrane involvement.   Past Medical History- hyperglycemia, hyperlipidemia  Medications- reviewed and updated, none  Objective: BP 92/60 mmHg  Pulse 68  Temp(Src) 97.5 F (36.4 C)  Wt 237 lb (107.502 kg) Gen: NAD, resting comfortably CV: RRR no murmurs rubs or gallops Lungs: CTAB no crackles, wheeze, rhonchi Ext: no edema Skin: warm, dry, on bilateral arms has 5-10 lesions. At least 5 of these lesions in total have a circular or oval appearance with a raised border, none bigger than 4 x 4 cm. Other lesions appear like papules with smallest 1 x 1 cm and most in circular or oval appearance-some with excoriation. Slight erythematous base beneath all lesions.   Assessment/Plan:  Rash likely tinea corporis On first view, concerned for contact dermatitis, but when I reentered the room, and patient had changed positions the lesions had appearance more consistent with several tinea corporis spots-raised edge, circular shape with cleared central area (at least 1/3 of lesions). As such, changed course to treat with clotrimazole twice a day for 1-3 weeks. Unclear exposure. Discussed derm referral if does not clear within 3 weeks or worsening within a week.   Meds ordered this encounter  . clotrimazole  (LOTRIMIN) 1 % cream    Sig: Apply 1 application topically 2 (two) times daily. For 1-3 weeks.    Dispense:  60 g    Refill:  1

## 2016-11-05 ENCOUNTER — Ambulatory Visit (INDEPENDENT_AMBULATORY_CARE_PROVIDER_SITE_OTHER): Payer: BLUE CROSS/BLUE SHIELD | Admitting: Family Medicine

## 2016-11-05 ENCOUNTER — Encounter: Payer: Self-pay | Admitting: Family Medicine

## 2016-11-05 VITALS — BP 120/78 | HR 73 | Temp 98.7°F | Ht 69.0 in | Wt 224.8 lb

## 2016-11-05 DIAGNOSIS — M791 Myalgia, unspecified site: Secondary | ICD-10-CM

## 2016-11-05 DIAGNOSIS — R509 Fever, unspecified: Secondary | ICD-10-CM | POA: Diagnosis not present

## 2016-11-05 NOTE — Progress Notes (Addendum)
PCP: Maxwell ConchStephen Shail Urbas, MD  Subjective:  Maxwell Kerr is a 41 y.o. year old very pleasant male patient who presents with flu like symptoms including fever,body aches. No flu shot this year and does not desire.  -other symptoms: some cough, congestion, headache -started: last Thursday but came on gradual -inside 48 hour treatment window if needed for tamiflu: no -high risk condition (children <5, adults >65, chronic pulmonary or cardiac condition, immunosuppression, pregnancy, nursing home resident, morbid obesity) : no -symptoms are improving -previous treatments: acetaminophen - possible sick contacts around holidays; specifically influenza: no  ROS-denies SOB, NVD, sinus or dental pain  Pertinent Past Medical History-  Patient Active Problem List   Diagnosis Date Noted  . Blood in stool 08/03/2014    Priority: High  . Hyperlipidemia 01/10/2015    Priority: Medium  . Hyperglycemia 01/10/2015    Priority: Medium  . Headache(784.0) 08/03/2014    Priority: Low  . GERD (gastroesophageal reflux disease) 08/03/2014    Priority: Low  . Exposure to STD 08/03/2014    Priority: Low    Medications- reviewed  No current outpatient prescriptions on file.   No current facility-administered medications for this visit.     Objective: BP 120/78 (BP Location: Left Arm, Patient Position: Sitting, Cuff Size: Large)   Pulse 73   Temp 98.7 F (37.1 C) (Oral)   Ht 5\' 9"  (1.753 m)   Wt 224 lb 12.8 oz (102 kg)   SpO2 97%   BMI 33.20 kg/m  Gen: NAD, appears fatigued HEENT: Turbinates erythematous, TM normal, pharynx mildly erythematous with no tonsilar exudate or edema, no sinus tenderness CV: RRR no murmurs rubs or gallops Lungs: CTAB no crackles, wheeze, rhonchi Abdomen: soft/nontender/nondistended/normal bowel sounds. Ext: no edema Skin: warm, dry, no rash  Assessment/Plan:  Flu-like illness with fever/body aches History and exam today are suggestive of viral process. Patients  influenza test was not done.  Pretest probability of influenza was moderate. Given day 6 and fact that patient is improving even if test positive we discussed would not likely treat.  Symptomatic treatment with: rest, hydration- encouraged to stay out of work at least through rest of the day. He was reassured by the fact no pneumonia or bronchitis on exam which was his main concern.   Finally, we reviewed reasons to return to care including if symptoms worsen or persist or new concerns arise.  Maxwell ConchStephen Virginio Isidore, MD

## 2016-11-05 NOTE — Patient Instructions (Signed)
No obvious pneumonia or bronchitis. If you were to have fever again or shortness of breath or symptoms continue for another week- see us again to recheck.

## 2016-11-05 NOTE — Progress Notes (Signed)
Pre visit review using our clinic review tool, if applicable. No additional management support is needed unless otherwise documented below in the visit note. 

## 2019-04-20 ENCOUNTER — Encounter: Payer: Self-pay | Admitting: Family Medicine

## 2019-04-20 ENCOUNTER — Ambulatory Visit: Payer: Self-pay | Admitting: Family Medicine

## 2019-04-20 ENCOUNTER — Ambulatory Visit (INDEPENDENT_AMBULATORY_CARE_PROVIDER_SITE_OTHER): Payer: BC Managed Care – PPO | Admitting: Family Medicine

## 2019-04-20 DIAGNOSIS — Z20822 Contact with and (suspected) exposure to covid-19: Secondary | ICD-10-CM

## 2019-04-20 DIAGNOSIS — Z20828 Contact with and (suspected) exposure to other viral communicable diseases: Secondary | ICD-10-CM | POA: Diagnosis not present

## 2019-04-20 NOTE — Progress Notes (Signed)
    Chief Complaint:  Maxwell Kerr is a 43 y.o. male who presents today for a virtual office visit with a chief complaint of [place] exposure.   Assessment/Plan:  COVID19 Exposure Not currently having any symptoms.  Given that he after has not had any symptoms after 1 week of his last known contact, does not need testing at this point.  Advised him to be on the lookout for signs of infection including cough, chest pain, shortness of breath, fever, and body aches.  Discussed reasons to return to care.    Subjective:  HPI:  COVID 19 exposure Patient was on a golfing trip with a group of friends a week ago. Today found out that one of the friends he was golfing with recently tested for [place].  The person who tested positive for COVID was staying in a different hotel the note he stated.  He has not had any symptoms over the past week.  His last day of contact was over a week ago.  No cough or fevers.  No chest pain or shortness of breath.  ROS: Per HPI  PMH: He reports that he has never smoked. He has never used smokeless tobacco. He reports current alcohol use of about 5.0 standard drinks of alcohol per week. He reports that he does not use drugs.      Objective/Observations  Physical Exam: Gen: NAD, resting comfortably Pulm: Normal work of breathing Neuro: Grossly normal, moves all extremities Psych: Normal affect and thought content  Virtual Visit via Video   I connected with Maxwell Kerr on 04/20/19 at 10:40 AM EDT by a video enabled telemedicine application and verified that I am speaking with the correct person using two identifiers. I discussed the limitations of evaluation and management by telemedicine and the availability of in person appointments. The patient expressed understanding and agreed to proceed.   Patient location: Home Provider location: [place] participating in the virtual visit: Myself and Patient     Time Spent: I spent >15  minutes face-to-face with the patient, with more than half spent on counseling for management plan for his COVID-19 exposure.   Algis Greenhouse. Jerline Pain, MD 04/20/2019 10:57 AM

## 2019-04-20 NOTE — Telephone Encounter (Signed)
Pt. Reports he was on a golfing trip with friends and one of them has tested positive for COVID 19. Pt. Does not have symptoms, but would like a virtual visit. Warm transfer to [facility].  Answer Assessment - Initial Assessment Questions 1. CLOSE CONTACT: "Who is the person with the confirmed or suspected COVID-19 infection that you were exposed to?"     Friend tested positive 2. PLACE of CONTACT: "Where were you when you were exposed to COVID-19?" (e.g., home, school, medical waiting room; which city?)     Last time 1 week 3. TYPE of CONTACT: "How much contact was there?" (e.g., sitting next to, live in same house, work in same office, same building)     Vacation together 4. DURATION of CONTACT: "How long were you in contact with the COVID-19 patient?" (e.g., a few seconds, passed by person, a few minutes, live with the patient)     Several days 5. DATE of CONTACT: "When did you have contact with a COVID-19 patient?" (e.g., how many days ago)     Last week 6. TRAVEL: "Have you traveled out of the country recently?" If so, "When and where?"     * Also ask about out-of-state travel, since the CDC has identified some high-risk cities for community spread in the [place].     * Note: Travel becomes less relevant if there is widespread community transmission where the patient lives.     No 7. COMMUNITY SPREAD: "Are there lots of cases of COVID-19 (community spread) where you live?" (See public health department website, if unsure)       Yes 8. SYMPTOMS: "Do you have any symptoms?" (e.g., fever, cough, breathing difficulty)     No 9. PREGNANCY OR POSTPARTUM: "Is there any chance you are pregnant?" "When was your last menstrual period?" "Did you deliver in the last 2 weeks?"     N/A 10. HIGH RISK: "Do you have any heart or lung problems? Do you have a weak immune system?" (e.g., CHF, COPD, asthma, HIV positive, chemotherapy, renal failure, diabetes mellitus, sickle cell anemia)       No  Protocols used:  CORONAVIRUS (COVID-19) EXPOSURE-A-AH

## 2019-04-20 NOTE — Telephone Encounter (Signed)
Patient was seen in the office this morning 

## 2020-01-26 ENCOUNTER — Encounter: Payer: Self-pay | Admitting: Family Medicine

## 2020-01-27 ENCOUNTER — Ambulatory Visit: Payer: Self-pay | Attending: Family

## 2020-01-27 DIAGNOSIS — Z23 Encounter for immunization: Secondary | ICD-10-CM

## 2020-01-27 NOTE — Progress Notes (Signed)
   Covid-19 Vaccination Clinic  Name:  Maxwell Kerr    MRN: [id number] DOB: 01/26/1976  01/27/2020  Maxwell Kerr was observed post Covid-19 immunization for 15 minutes without incident. He was provided with Vaccine Information Sheet and instruction to access the V-Safe system.   Maxwell Kerr was instructed to call 911 with any severe reactions post vaccine: [place] Kitchen Difficulty breathing  . Swelling of face and throat  . A fast heartbeat  . A bad rash all over body  . Dizziness and weakness   Immunizations Administered    Name Date Dose VIS Date Route   Moderna COVID-19 Vaccine 01/27/2020 10:11 AM 0.5 mL 10/05/2019 Intramuscular   Manufacturer: Moderna   Lot: [id number]   NDC: 94446-190-12

## 2020-02-29 ENCOUNTER — Ambulatory Visit: Payer: Self-pay | Attending: Family

## 2020-02-29 DIAGNOSIS — Z23 Encounter for immunization: Secondary | ICD-10-CM

## 2020-02-29 NOTE — Progress Notes (Signed)
   Covid-19 Vaccination Clinic  Name:  Maxwell Kerr    MRN: [id number] DOB: [birth year]  02/29/2020  Maxwell Kerr was observed post Covid-19 immunization for 15 minutes without incident. He was provided with Vaccine Information Sheet and instruction to access the V-Safe system.   Maxwell Kerr was instructed to call 911 with any severe reactions post vaccine: [place] Kitchen Difficulty breathing  . Swelling of face and throat  . A fast heartbeat  . A bad rash all over body  . Dizziness and weakness   Immunizations Administered    Name Date Dose VIS Date Route   Moderna COVID-19 Vaccine 02/29/2020 10:04 AM 0.5 mL 10/2019 Intramuscular   Manufacturer: Moderna   Lot: [id number]   NDC: 56256-389-37

## 2021-07-09 ENCOUNTER — Other Ambulatory Visit: Payer: Self-pay

## 2021-07-09 ENCOUNTER — Encounter (HOSPITAL_COMMUNITY): Payer: Self-pay | Admitting: Emergency Medicine

## 2021-07-09 ENCOUNTER — Emergency Department (HOSPITAL_COMMUNITY)
Admission: EM | Admit: 2021-07-09 | Discharge: 2021-07-09 | Disposition: A | Discharge status: Home / Self Care | Payer: Self-pay | Attending: Emergency Medicine | Admitting: Emergency Medicine

## 2021-07-09 DIAGNOSIS — H60502 Unspecified acute noninfective otitis externa, left ear: Secondary | ICD-10-CM

## 2021-07-09 DIAGNOSIS — H6092 Unspecified otitis externa, left ear: Secondary | ICD-10-CM | POA: Insufficient documentation

## 2021-07-09 MED ORDER — CIPROFLOXACIN-DEXAMETHASONE 0.3-0.1 % OT SUSP: 4.0000 [drp] | Freq: Two times a day (BID) | OTIC | 0 refills | Status: AC

## 2021-07-09 NOTE — ED Triage Notes (Signed)
Pt began having ear throbbing on Friday, today he is having a hard time hearing anything out of his left ear.  No other symptoms at this time.

## 2021-07-09 NOTE — ED Provider Notes (Signed)
Is any [facility] EMERGENCY DEPARTMENT Provider Note   CSN: [id number] Arrival date & time: 07/09/21  4098     History Chief Complaint  Patient presents with   Ear Pain    Maxwell Kerr is a 44 y.o. male who presents with 72 hours of left ear irritation and pressure now with muffled hearing since this morning.  Swimming or trauma to the ear but states he does routinely aggressively clean his ear with Q-tips. Clear to yellow discharge from the left ear. He denies any other systemic symptoms or infectious symptoms.    I have personally reviewed this patient's medical records.  His history of GERD, hypertension, hyperlipidemia, and headaches.  He is not on any anticoagulation.  HPI     Past Medical History:  Diagnosis Date   Blood in stool    Chicken pox 08/03/2014   Elevated blood pressure    once when in ED per patient   GERD (gastroesophageal reflux disease)    1x chest pain   Headache    every other month. ? concussion related from HS football   Seasonal allergies    as child    Patient Active Problem List   Diagnosis Date Noted   Hyperlipidemia 01/10/2015   Hyperglycemia 01/10/2015   Blood in stool 08/03/2014   Headache(784.0) 08/03/2014   GERD (gastroesophageal reflux disease) 08/03/2014   Exposure to STD 08/03/2014    Past Surgical History:  Procedure Laterality Date   none         Family History  Problem Relation Age of Onset   Alcoholism Father    Hyperlipidemia Father    Hypertension Father    Arthritis Other        grandparents   Breast cancer Mother    Sudden death Brother        40s-very active guy-not clear cause    Social History   Tobacco Use   Smoking status: Never   Smokeless tobacco: Never  Substance Use Topics   Alcohol use: Yes    Alcohol/week: 5.0 standard drinks    Types: 5 Standard drinks or equivalent per week    Comment: occassionally   Drug use: No    Home Medications Prior to Admission medications    Medication Sig Start Date End Date Taking? Authorizing Provider  acetaminophen (TYLENOL) 325 MG tablet Take 650 mg by mouth every 6 (six) hours as needed for moderate pain or headache.   Yes [provider]  Carbamide Peroxide (EAR DROPS OT) Place 3 drops into the left ear at bedtime as needed (ear pain).   Yes [provider]  OVER THE COUNTER MEDICATION Take 2 tablets by mouth daily. Vita fusion with b 12 gummy   Yes [provider]    Allergies    Patient has no known allergies.  Review of Systems   Review of Systems  Constitutional: Negative.   HENT:  Positive for ear discharge, ear pain and hearing loss. Negative for sore throat, trouble swallowing and voice change.   Eyes: Negative.   Respiratory: Negative.    Cardiovascular: Negative.   Gastrointestinal: Negative.   Genitourinary: Negative.   Neurological: Negative.    Physical Exam Updated Vital Signs BP 130/84 (BP Location: Right Arm)   Pulse 75   Temp 98.3 F (36.8 C) (Oral)   Resp 18   SpO2 97%   Physical Exam Vitals and nursing note reviewed.  Constitutional:      Appearance: He is obese. He  is not ill-appearing or toxic-appearing.  HENT:     Head: Normocephalic and atraumatic.     Right Ear: Tympanic membrane, ear canal and external ear normal. No mastoid tenderness.     Left Ear: Decreased hearing noted. Drainage and swelling present. There is no impacted cerumen. No foreign body. No mastoid tenderness.     Ears:     Comments: Left EAC with clear to yellow runny discharge, as well as erythema and edema of the walls of the EAC.  Left EAC debrided with small ear curette with removal of significant amount of thick mucousy discharge with subsequent improvement in patient's hearing.  TM examination limited by edema of EAC, however visualized TM appears intact without perforation.    Mouth/Throat:     Pharynx: Oropharynx is clear. Uvula midline.     Tonsils: No tonsillar exudate.  Eyes:      General: No scleral icterus.       Right eye: No discharge.        Left eye: No discharge.     Conjunctiva/sclera: Conjunctivae normal.  Neck:     Trachea: Trachea and phonation normal.  Pulmonary:     Effort: Pulmonary effort is normal.  Musculoskeletal:     Cervical back: Normal range of motion. No edema, rigidity or crepitus. No pain with movement, spinous process tenderness or muscular tenderness.  Lymphadenopathy:     Cervical: No cervical adenopathy.  Skin:    General: Skin is warm and dry.  Neurological:     General: No focal deficit present.     Mental Status: He is alert.  Psychiatric:        Mood and Affect: Mood normal.    ED Results / Procedures / Treatments   Labs (all labs ordered are listed, but only abnormal results are displayed) Labs Reviewed - No data to display  EKG None  Radiology No results found.  Procedures Procedures   Medications Ordered in ED Medications - No data to display  ED Course  I have reviewed the triage vital signs and the nursing notes.  Pertinent labs & imaging results that were available during my care of the patient were reviewed by me and considered in my medical decision making (see chart for details).    MDM Rules/Calculators/A&P                         45 year old male with history of left ear irritation x3 days with drainage and muffled hearing since this morning.  No recent swimming activities.  Differential diagnosis includes not limited to otitis externa, malignant otitis externa, herpes zoster otic [place], contact dermatitis, TM rupture, otitis media.  Vital signs are normal intake.  Cardiopulmonary exam is normal.  Ear exam as above without mastoid tenderness palpation bilaterally but with left ear drainage and edema of the EAC.  TMs intact bilaterally, though examination of the left TM limited secondary to edema of the EAC.  Exam most consistent with otitis externa, no skin changes to suggest herpes zoster  oticus, and no mastoid tenderness palpation to suggest malignant otitis externa.  Will discharge patient with prescription for Ciprodex drops, safest given incomplete TM exam on the left to rule out perforation.  Recommend outpatient follow-up with his PCP.  Kraven voiced understanding with medical evaluation and treatment plan.  Each of his questions was answered to his expressed satisfaction.  Return precautions were given.  Patient is well-appearing, stable, and appropriate for discharge at this  time.  This chart was dictated using voice recognition software, Dragon. Despite the best efforts of this provider to proofread and correct errors, errors may still occur which can change documentation meaning.   Final Clinical Impression(s) / ED Diagnoses Final diagnoses:  None    Rx / DC Orders ED Discharge Orders     None        Sherrilee Gilles 07/09/21 0827    Koleen Distance, MD 07/09/21 [phone]

## 2021-07-09 NOTE — ED Notes (Signed)
Patient verbalizes understanding of discharge instructions. Opportunity for questioning and answers were provided. Armband removed by staff, pt discharged from ED and ambulated to lobby to return home.   

## 2021-07-09 NOTE — Discharge Instructions (Addendum)
You are seen in the ER today for the muffled sensation in your left ear as well as the irritation.  You have been diagnosed with otitis externa, which is infection of the tissue of the inner ear canal.   You have been prescribed antibiotic eardrops to use twice a day for the next week.  Please use as prescribed for the entire course.  You may follow-up with your primary care doctor.  Return to the ER or urgent care with any worsening ear pain, loss of hearing, dizziness, or any other new severe symptoms.

## 2021-07-10 ENCOUNTER — Encounter: Payer: Self-pay | Admitting: Family Medicine

## 2021-07-10 ENCOUNTER — Ambulatory Visit (INDEPENDENT_AMBULATORY_CARE_PROVIDER_SITE_OTHER): Payer: Self-pay | Admitting: Family Medicine

## 2021-07-10 ENCOUNTER — Ambulatory Visit: Payer: Self-pay | Admitting: Family Medicine

## 2021-07-10 VITALS — BP 136/82 | HR 92 | Temp 98.0°F | Wt 251.2 lb

## 2021-07-10 DIAGNOSIS — H6000 Abscess of external ear, unspecified ear: Secondary | ICD-10-CM

## 2021-07-10 DIAGNOSIS — H6002 Abscess of left external ear: Secondary | ICD-10-CM

## 2021-07-10 NOTE — Patient Instructions (Signed)
Suspect you had furuncle/boil that ruptured and drained  Use some warm compresses and this should continue to improve.

## 2021-07-10 NOTE — Progress Notes (Incomplete)
  Phone [phone] In person visit   Subjective:   Maxwell Kerr is a 45 y.o. year old very pleasant male patient who presents for/with See problem oriented charting No chief complaint on file.   This visit occurred during the SARS-CoV-2 public health emergency.  Safety protocols were in place, including screening questions prior to the visit, additional usage of staff PPE, and extensive cleaning of exam room while observing appropriate contact time as indicated for disinfecting solutions.   Past Medical History-  Patient Active Problem List   Diagnosis Date Noted   Hyperlipidemia 01/10/2015   Hyperglycemia 01/10/2015   Blood in stool 08/03/2014   Headache(784.0) 08/03/2014   GERD (gastroesophageal reflux disease) 08/03/2014   Exposure to STD 08/03/2014    Medications- reviewed and updated Current Outpatient Medications  Medication Sig Dispense Refill   acetaminophen (TYLENOL) 325 MG tablet Take 650 mg by mouth every 6 (six) hours as needed for moderate pain or headache.     Carbamide Peroxide (EAR DROPS OT) Place 3 drops into the left ear at bedtime as needed (ear pain).     ciprofloxacin-dexamethasone (CIPRODEX) OTIC suspension Place 4 drops into the left ear 2 (two) times daily for 7 days. 7.5 mL 0   OVER THE COUNTER MEDICATION Take 2 tablets by mouth daily. Vita fusion with b 12 gummy     No current facility-administered medications for this visit.     Objective:  There were no vitals taken for this visit. Gen: NAD, resting comfortably CV: RRR no murmurs rubs or gallops Lungs: CTAB no crackles, wheeze, rhonchi Abdomen: soft/nontender/nondistended/normal bowel sounds. No rebound or guarding.  Ext: no edema Skin: warm, dry Neuro: grossly normal, moves all extremities  ***    Assessment and Plan  # Hospital F/U for Acute otitis external of left ear S:Patient was seen in the ED on 07/09/2021 by Dr. Delford Field, MD. He presented with 72 hours of left ear irritation and  pressure - he also noted having muffled hearing since that morning prior to the visit. Swimming or trauma to the ear but stated he does routinely aggressively clean his ear with Q-tips. He denied any other systemic symptoms or infectious symptoms. Clear to yellow discharge from the left ear.  Exam most consistent with otitis externa - safest given incomplete TM exam on the left to rule out perforation.    No labs or imaging was ordered during this encounter.  Patient was discharged with a prescription of Ciprodex drops.   *** A/P: ***    Recommended follow up: No follow-ups on file. No future appointments.  Lab/Order associations: No diagnosis found.  No orders of the defined types were placed in this encounter.  I,Harris Phan,acting as a Neurosurgeon for Tana Conch, MD.,have documented all relevant documentation on the behalf of Tana Conch, MD,as directed by  Tana Conch, MD while in the presence of Tana Conch, MD.     *** Return precautions advised.  Rich Number

## 2021-07-10 NOTE — Progress Notes (Signed)
Established Patient Office Visit  Subjective:  Patient ID: Maxwell Kerr, male    DOB: [birth year]  Age: 45 y.o. MRN: [id number]  CC:  Chief Complaint  Patient presents with   Ear Fullness    Went to ED, was infected, got ear drops . Using/used drops but still sounds muffled. Still hears popping when moving mouth. Takes half a day if not a whole day to even hear without the muffle    HPI Maxwell Kerr presents for recent left ear discomfort.  Went to the ER yesterday.  He was diagnosed with otitis externa.  He was prescribed Ciprodex drops which she is using twice daily.  He states that they were able to get some drainage out of the canal which seemed to help yesterday.  He sometimes has a "popping "sound when moving his mouth back-and-forth.  Much less pain today.  He had some crusted drainage outside the ear canal recently.  No recent swimming.  Occasionally uses Q-tips.  Prior to couple days ago he had noticed some pain on the inside of the tragus region.  Past Medical History:  Diagnosis Date   Blood in stool    Chicken pox 08/03/2014   Elevated blood pressure    once when in ED per patient   GERD (gastroesophageal reflux disease)    1x chest pain   Headache    every other month. ? concussion related from HS football   Seasonal allergies    as child    Past Surgical History:  Procedure Laterality Date   none      Family History  Problem Relation Age of Onset   Alcoholism Father    Hyperlipidemia Father    Hypertension Father    Arthritis Other        grandparents   Breast cancer Mother    Sudden death Brother        40s-very active guy-not clear cause    Social History   Socioeconomic History   Marital status: Single    Spouse name: Not on file   Number of children: Not on file   Years of education: Not on file   Highest education level: Not on file  Occupational History   Not on file  Tobacco Use   Smoking status: Never   Smokeless tobacco: Never  Substance  and Sexual Activity   Alcohol use: Yes    Alcohol/week: 5.0 standard drinks    Types: 5 Standard drinks or equivalent per week    Comment: occassionally   Drug use: No   Sexual activity: Not on file  Other Topics Concern   Not on file  Social History Narrative   Single. 1 daughter Charlsie Quest who is 52 and lives in [place] as of 2013. Drives down to see her weekly.    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     Carbamide  Peroxide (EAR DROPS OT) Place 3 drops into the left ear at bedtime as needed (ear pain).     ciprofloxacin-dexamethasone (CIPRODEX) OTIC suspension Place 4 drops into the left ear 2 (two) times daily for 7 days. 7.5 mL 0   OVER THE COUNTER MEDICATION Take 2 tablets by mouth daily. Vita fusion with b 12 gummy     No facility-administered medications prior to visit.    No Known Allergies  ROS Review of Systems  Constitutional:  Negative for chills and fever.  HENT:  Positive for ear discharge. Negative for hearing loss.      Objective:    Physical Exam Vitals reviewed.  Constitutional:      Appearance: Normal appearance.  HENT:     Ears:     Comments: Right canal and eardrum appear normal.  His left canal reveals on the inner aspect of the tragus inside the canal small area of drainage with some pinkish colored tissue at the surface.  Does not have any significant purulence in the  canal at this time and portion of eardrum visualized appears normal.  He does not have any significant canal erythema or significant swelling of the canal otherwise. Neurological:     Mental Status: He is alert.    BP 136/82 (BP Location: Right Arm, Patient Position: Sitting, Cuff Size: Large)   Pulse 92   Temp 98 F (36.7 C) (Oral)   Wt 251 lb 3.2 oz (113.9 kg)   SpO2 97%   BMI 37.10 kg/m  Wt Readings from Last 3 Encounters:  07/10/21 251 lb 3.2 oz (113.9 kg)  11/05/16 224 lb 12.8 oz (102 kg)  04/04/15 237 lb (107.5 kg)     Health Maintenance Due  Topic Date Due   Hepatitis C Screening  Never done   COVID-19 Vaccine (3 - Booster for Moderna series) 07/31/2020   INFLUENZA VACCINE  Never done    There are no preventive care reminders to display for this patient.  Lab Results  Component Value Date   TSH 2.78 01/03/2015   Lab Results  Component Value Date   WBC 8.7 01/03/2015   HGB 15.9 01/03/2015   HCT 47.1 01/03/2015   MCV 88.7 01/03/2015   PLT 230.0 01/03/2015   Lab Results  Component Value Date   NA 141 01/03/2015   K 4.1 01/03/2015   CO2 28 01/03/2015   GLUCOSE 100 (H) 01/03/2015   BUN 13 01/03/2015   CREATININE 0.97 01/03/2015   BILITOT 1.1 01/03/2015   ALKPHOS 94 01/03/2015   AST 17 01/03/2015   ALT 18 01/03/2015   PROT 7.4 01/03/2015   ALBUMIN 4.4 01/03/2015   CALCIUM 9.6 01/03/2015   ANIONGAP 14 07/21/2014   GFR 112.24 01/03/2015   Lab Results  Component Value Date   CHOL 248 (H) 01/03/2015   Lab Results  Component Value Date   HDL 48.00 01/03/2015   Lab Results  Component Value Date   LDLCALC 168 (H) 01/03/2015   Lab Results  Component Value Date   TRIG 162.0 (H) 01/03/2015   Lab Results  Component Value Date   CHOLHDL 5 01/03/2015   No results found for: HGBA1C    Assessment & Plan:   Recent pain and drainage from right ear canal.  No evidence clinically to suggest otitis externa at this time.  Suspect he had small furuncle  that ruptured as area of swelling is very localized inside the tragus and is improving at this time.  -Continue warm compresses  for another couple days -Suspect this will continue to improve. -He will continue with eardrops -Follow-up for any recurrent pain or other concerns.    Follow-up: No follow-ups on file.    Evelena Peat, MD

## 2021-07-18 ENCOUNTER — Encounter: Payer: Self-pay | Admitting: Family Medicine

## 2022-07-29 ENCOUNTER — Encounter: Payer: Self-pay | Admitting: *Deleted

## 2022-10-17 ENCOUNTER — Encounter: Payer: Self-pay | Admitting: *Deleted

## 2024-05-24 ENCOUNTER — Ambulatory Visit: Payer: Self-pay | Admitting: Physician Assistant

## 2024-05-24 ENCOUNTER — Encounter: Payer: Self-pay | Admitting: Physician Assistant

## 2024-05-24 VITALS — BP 120/70 | HR 89 | Temp 97.7°F | Ht 69.0 in | Wt 243.4 lb

## 2024-05-24 DIAGNOSIS — R350 Frequency of micturition: Secondary | ICD-10-CM | POA: Diagnosis not present

## 2024-05-24 DIAGNOSIS — Z1211 Encounter for screening for malignant neoplasm of colon: Secondary | ICD-10-CM | POA: Diagnosis not present

## 2024-05-24 DIAGNOSIS — Z8042 Family history of malignant neoplasm of prostate: Secondary | ICD-10-CM

## 2024-05-24 LAB — CBC WITH DIFFERENTIAL/PLATELET
Basophils Absolute: 0 K/uL (ref 0.0–0.1)
Basophils Relative: 0.5 % (ref 0.0–3.0)
Eosinophils Absolute: 0.1 K/uL (ref 0.0–0.7)
Eosinophils Relative: 1.5 % (ref 0.0–5.0)
HCT: 43.9 % (ref 39.0–52.0)
Hemoglobin: 14.6 g/dL (ref 13.0–17.0)
Lymphocytes Relative: 39.7 % (ref 12.0–46.0)
Lymphs Abs: 2.5 K/uL (ref 0.7–4.0)
MCHC: 33.3 g/dL (ref 30.0–36.0)
MCV: 84.2 fl (ref 78.0–100.0)
Monocytes Absolute: 0.9 K/uL (ref 0.1–1.0)
Monocytes Relative: 14.4 % — ABNORMAL HIGH (ref 3.0–12.0)
Neutro Abs: 2.8 K/uL (ref 1.4–7.7)
Neutrophils Relative %: 43.9 % (ref 43.0–77.0)
Platelets: 228 K/uL (ref 150.0–400.0)
RBC: 5.21 Mil/uL (ref 4.22–5.81)
RDW: 13.5 % (ref 11.5–15.5)
WBC: 6.3 K/uL (ref 4.0–10.5)

## 2024-05-24 LAB — POCT URINALYSIS DIPSTICK
Bilirubin, UA: NEGATIVE
Blood, UA: NEGATIVE
Glucose, UA: NEGATIVE
Ketones, UA: NEGATIVE
Leukocytes, UA: NEGATIVE
Nitrite, UA: NEGATIVE
Protein, UA: NEGATIVE
Spec Grav, UA: 1.01 (ref 1.010–1.025)
Urobilinogen, UA: 1 U/dL
pH, UA: 8.5 — AB (ref 5.0–8.0)

## 2024-05-24 LAB — COMPREHENSIVE METABOLIC PANEL WITH GFR
ALT: 20 U/L (ref 0–53)
AST: 15 U/L (ref 0–37)
Albumin: 4 g/dL (ref 3.5–5.2)
Alkaline Phosphatase: 107 U/L (ref 39–117)
BUN: 12 mg/dL (ref 6–23)
CO2: 28 meq/L (ref 19–32)
Calcium: 9.4 mg/dL (ref 8.4–10.5)
Chloride: 104 meq/L (ref 96–112)
Creatinine, Ser: 0.78 mg/dL (ref 0.40–1.50)
GFR: 107.33 mL/min (ref >60.00)
Glucose, Bld: 115 mg/dL — ABNORMAL HIGH (ref 70–99)
Potassium: 4.4 meq/L (ref 3.5–5.1)
Sodium: 139 meq/L (ref 135–145)
Total Bilirubin: 0.8 mg/dL (ref 0.2–1.2)
Total Protein: 6.3 g/dL (ref 6.0–8.3)

## 2024-05-24 LAB — PSA: PSA: 0.12 ng/mL (ref 0.10–4.00)

## 2024-05-24 NOTE — Patient Instructions (Signed)
 It was great to see you!  We will update blood work to assess your prostate function Please reach out if symptom(s) return/worsen  I will go ahead and place colonoscopy referral as well!  Take care,  Lucie Buttner PA-C

## 2024-05-24 NOTE — Progress Notes (Signed)
 Maxwell Kerr is a 48 y.o. male here for a new problem.  History of Present Illness:   Chief Complaint  Patient presents with   Urinary Frequency    Pt c/o urinary frequency started on Thursday every 30 minutes started to get better on Saturday. Low pelvic pressure.    HPI  Urinary frequency Pt complains of urinary frequency and associated lower pelvic pressure starting Thursday 7/17.  On Thursday, he reports feeling a constant pelvic pressure without pain and would use the bathroom every 30 minutes with a weakened stream. Pt would press on his pubic bone to aid urination. On Friday, he reports increased discomfort and still felt the urge every 30 minutes when he would lie down. Over the weekend, he reports his symptoms started improving and today feels better, but would like to double check that everything is normal. This is his first time experiencing these symptoms.  Notes fhx of prostate cancer in his father who passed in 2020. He is unsure of his father's age at time of diagnosis. Endorses normal bowel movement, and daily proper water intake and exercise.  Denies flu-like symptom(s), fever, chills, fatigue, constipation, hx of kidney stones, hx of blood in urine, or concern for STDs.   Colon cancer screening He is overdue for this and agreeable to referral  Past Medical History:  Diagnosis Date   Blood in stool    Chicken pox 08/03/2014   Elevated blood pressure    once when in ED per patient   GERD (gastroesophageal reflux disease)    1x chest pain   Headache    every other month. ? concussion related from HS football   Seasonal allergies    as child     Social History   Tobacco Use   Smoking status: Never   Smokeless tobacco: Never  Substance Use Topics   Alcohol use: Yes    Alcohol/week: 5.0 standard drinks of alcohol    Types: 5 Standard drinks or equivalent per week    Comment: occassionally   Drug use: No    Past Surgical History:  Procedure  Laterality Date   none      Family History  Problem Relation Age of Onset   Breast cancer Mother    Alcoholism Father    Hyperlipidemia Father    Hypertension Father    Prostate cancer Father    Sudden death Brother        40s-very active guy-not clear cause   Arthritis Other        grandparents    No Known Allergies  Current Medications:   Current Outpatient Medications:    acetaminophen (TYLENOL) 325 MG tablet, Take 650 mg by mouth every 6 (six) hours as needed for moderate pain or headache., Disp: , Rfl:    Carbamide Peroxide (EAR DROPS OT), Place 3 drops into the left ear at bedtime as needed (ear pain)., Disp: , Rfl:    OVER THE COUNTER MEDICATION, Take 2 tablets by mouth daily. Vita fusion with b 12 gummy, Disp: , Rfl:    Review of Systems:   Negative unless otherwise specified per HPI.  Vitals:   Vitals:   05/24/24 0908  BP: 120/70  Pulse: 89  Temp: 97.7 F (36.5 C)  TempSrc: Temporal  SpO2: 97%  Weight: 243 lb 6.1 oz (110.4 kg)  Height: 5' 9 (1.753 m)     Body mass index is 35.94 kg/m.  Physical Exam:   Physical Exam Vitals and nursing note reviewed.  Constitutional:      General: He is not in acute distress.    Appearance: He is well-developed. He is not ill-appearing or toxic-appearing.  Cardiovascular:     Rate and Rhythm: Normal rate and regular rhythm.     Pulses: Normal pulses.     Heart sounds: Normal heart sounds, S1 normal and S2 normal.  Pulmonary:     Effort: Pulmonary effort is normal.     Breath sounds: Normal breath sounds.  Abdominal:     General: Abdomen is flat. Bowel sounds are normal.     Palpations: Abdomen is soft.     Tenderness: There is no abdominal tenderness. There is no right CVA tenderness or left CVA tenderness.  Skin:    General: Skin is warm and dry.  Neurological:     Mental Status: He is alert.     GCS: GCS eye subscore is 4. GCS verbal subscore is 5. GCS motor subscore is 6.  Psychiatric:        Speech:  Speech normal.        Behavior: Behavior normal. Behavior is cooperative.    Results for orders placed or performed in visit on 05/24/24  POCT urinalysis dipstick  Result Value Ref Range   Color, UA Yellow    Clarity, UA Clear    Glucose, UA Negative Negative   Bilirubin, UA Negative    Ketones, UA Negative    Spec Grav, UA 1.010 1.010 - 1.025   Blood, UA Negative    pH, UA 8.5 (A) 5.0 - 8.0   Protein, UA Negative Negative   Urobilinogen, UA 1.0 0.2 or 1.0 E.U./dL   Nitrite, UA Negative    Leukocytes, UA Negative Negative   Appearance     Odor      Assessment and Plan:   1. Urinary frequency (Primary) - POCT urinalysis dipstick - Urine Culture - PSA - CBC with Differential/Platelet - Comprehensive metabolic panel with GFR  Symptom(s) have resolved and he almost canceled appointment today Urinalysis is unremarkable Will send off for urine culture out of an abundance of caution Will also update blood work, including PSA, given family history Low threshold to add antibiotic(s) if any flu-like symptom(s) appear  2. Special screening for malignant neoplasms, colon - Ambulatory referral to Gastroenterology  3. Family history of prostate cancer in father   I have added this information to his chart Will update PSA today  I, Lavern Simmers, acting as a Neurosurgeon for [employer], [place]., have documented all relevant documentation on the behalf of Lucie Buttner, [place], as directed by Lucie Buttner, PA while in the presence of Lucie Buttner, [place].  I, Lucie Buttner, [place], have reviewed all documentation for this visit. The documentation on 05/24/24 for the exam, diagnosis, procedures, and orders are all accurate and complete.  Lucie Buttner, PA-C

## 2024-05-25 LAB — URINE CULTURE
MICRO NUMBER:: 16724004
Result:: NO GROWTH
SPECIMEN QUALITY:: ADEQUATE

## 2024-06-28 ENCOUNTER — Encounter: Payer: Self-pay | Admitting: Family Medicine

## 2024-07-01 ENCOUNTER — Ambulatory Visit: Admitting: Physician Assistant

## 2024-08-25 ENCOUNTER — Encounter: Payer: Self-pay | Admitting: Physician Assistant

## 2024-09-01 ENCOUNTER — Encounter: Payer: Self-pay | Admitting: Family Medicine

## 2024-09-01 ENCOUNTER — Ambulatory Visit: Payer: Self-pay | Admitting: Family Medicine

## 2024-09-01 VITALS — BP 118/68 | HR 84 | Temp 97.7°F | Ht 69.0 in | Wt 224.8 lb

## 2024-09-01 DIAGNOSIS — M25511 Pain in right shoulder: Secondary | ICD-10-CM

## 2024-09-01 DIAGNOSIS — R739 Hyperglycemia, unspecified: Secondary | ICD-10-CM | POA: Diagnosis not present

## 2024-09-01 DIAGNOSIS — N529 Male erectile dysfunction, unspecified: Secondary | ICD-10-CM

## 2024-09-01 DIAGNOSIS — M25512 Pain in left shoulder: Secondary | ICD-10-CM

## 2024-09-01 DIAGNOSIS — E785 Hyperlipidemia, unspecified: Secondary | ICD-10-CM | POA: Diagnosis not present

## 2024-09-01 DIAGNOSIS — Z131 Encounter for screening for diabetes mellitus: Secondary | ICD-10-CM

## 2024-09-01 DIAGNOSIS — Z13220 Encounter for screening for lipoid disorders: Secondary | ICD-10-CM

## 2024-09-01 DIAGNOSIS — E669 Obesity, unspecified: Secondary | ICD-10-CM

## 2024-09-01 DIAGNOSIS — R6882 Decreased libido: Secondary | ICD-10-CM

## 2024-09-01 DIAGNOSIS — R1310 Dysphagia, unspecified: Secondary | ICD-10-CM | POA: Diagnosis not present

## 2024-09-01 DIAGNOSIS — G8929 Other chronic pain: Secondary | ICD-10-CM

## 2024-09-01 DIAGNOSIS — Z114 Encounter for screening for human immunodeficiency virus [HIV]: Secondary | ICD-10-CM

## 2024-09-01 DIAGNOSIS — Z0000 Encounter for general adult medical examination without abnormal findings: Secondary | ICD-10-CM | POA: Diagnosis not present

## 2024-09-01 DIAGNOSIS — Z113 Encounter for screening for infections with a predominantly sexual mode of transmission: Secondary | ICD-10-CM

## 2024-09-01 DIAGNOSIS — Z1211 Encounter for screening for malignant neoplasm of colon: Secondary | ICD-10-CM

## 2024-09-01 DIAGNOSIS — Z118 Encounter for screening for other infectious and parasitic diseases: Secondary | ICD-10-CM

## 2024-09-01 MED ORDER — SILDENAFIL CITRATE 100 MG PO TABS: 100.0000 mg | ORAL_TABLET | Freq: Every day | ORAL | 11 refills | Status: AC | PRN

## 2024-09-01 NOTE — Patient Instructions (Signed)
 Health Maintenance Due  Topic Date Due   Colonoscopy  Never done  [facility] contact Please call to schedule visit and/or procedure IF you do not hear within a week Address: [address] [place], [place], [place] [address] Phone: [phone]   We have placed a referral for you today to sports medicine - please call their # if you do not hear within a week (may be listed below or you may see mychart message within a few days with #).   - he is going to trial allergy medicine like zyrtec or claritin or xyzal along with increasing exercise gradually. If nasal issues aren't improving or if shortness of breath doesn't improve or certainly if worsens- agrees to return to see me to get x-ray- wanted to hold off today  Schedule a lab visit at the check out desk within 2 weeks MUST BE between 8-9 am. Return for future fasting labs meaning nothing but water after midnight please. Ok to take your medications with water.   Trial Viagra- half tablet to start  Recommended follow up: Return in about 1 year (around 09/01/2025) for physical or sooner if needed.Schedule b4 you leave.

## 2024-09-01 NOTE — Addendum Note (Signed)
 Addended by: KATRINKA GARNETTE KIDD on: 09/01/2024 10:19 AM   Modules accepted: Orders

## 2024-09-01 NOTE — Progress Notes (Addendum)
 Phone: [phone]    Subjective:  Patient presents today for their annual physical. Chief complaint-noted.   See problem oriented charting- ROS- full  review of systems was completed and negative  except for topics noted under acute/chronic concerns  The following were reviewed and entered/updated in epic: Past Medical History:  Diagnosis Date   Blood in stool    Chicken pox 08/03/2014   GERD (gastroesophageal reflux disease)    1x chest pain   Headache    every other month. ? concussion related from HS football   Seasonal allergies    as child   Patient Active Problem List   Diagnosis Date Noted   Blood in stool 08/03/2014    Priority: High   Hyperlipidemia 01/10/2015    Priority: Medium    Hyperglycemia 01/10/2015    Priority: Medium    Headache 08/03/2014    Priority: Low   GERD (gastroesophageal reflux disease) 08/03/2014    Priority: Low   Exposure to STD 08/03/2014    Priority: Low   Past Surgical History:  Procedure Laterality Date   testicular torsion surgery as a teen      Family History  Problem Relation Age of Onset   Breast cancer Mother    Alcoholism Father    Hyperlipidemia Father    Hypertension Father    Prostate cancer Father        advanced on diagnosis in 58s   Heart attack Father        in 39s after port placement for prostate cancer   Sudden death Half-Brother        40s-very active guy-not clear cause   Healthy Half-Brother    Arthritis Other        grandparents   Healthy Half-Sister    Healthy Half-Sister     Medications- reviewed and updated Current Outpatient Medications  Medication Sig Dispense Refill   acetaminophen (TYLENOL) 325 MG tablet Take 650 mg by mouth every 6 (six) hours as needed for moderate pain or headache.     Carbamide Peroxide (EAR DROPS OT) Place 3 drops into the left ear at bedtime as needed (ear pain).     OVER THE COUNTER MEDICATION Take 2 tablets by mouth daily. Vita fusion with b 12 gummy      sildenafil (VIAGRA) 100 MG tablet Take 1 tablet (100 mg total) by mouth daily as needed for erectile dysfunction. 10 tablet 11   No current facility-administered medications for this visit.    Allergies-reviewed and updated No Known Allergies  Social History   Social History Narrative   Single. 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 Oropharynx normal Neck: no thyromegaly CV: RRR no murmurs rubs or gallops Lungs: CTAB no crackles, wheeze, rhonchi Abdomen: soft/nontender/nondistended/normal bowel sounds. No rebound or guarding.  Ext:  no edema, 8 mm skin tag length on left upper leg Skin: warm, dry Neuro: grossly normal, moves all extremities, PERRLA     Assessment and Plan:  48 y.o. male presenting for annual physical.  Health Maintenance counseling: 1. Anticipatory guidance: Patient counseled regarding regular dental exams -q6 months, eye exams - mild vision change with time- reasonable to get an update,  avoiding smoking and second hand smoke , limiting alcohol to 2 beverages per day- very sparing, no illicit drugs.   2. Risk factor reduction:  Advised patient of need for regular exercise and diet rich and fruits and vegetables to reduce risk of heart attack and stroke.  Exercise- not exercising right now but plans to start back at gym and work on cardio.  Diet/weight management-has been working on weight loss- down 19 lbs  in 3 months- reports got as low as 219 but has stabilized. Soe of this was during a time whenhaving nausea/vomiting and had very limited diet- goal 200-205 Wt Readings from Last 3 Encounters:  09/01/24 224 lb 12.8 oz (102 kg)  05/24/24 243 lb 6.1 oz (110.4 kg)  07/10/21 251 lb 3.2 oz (113.9 kg)  3. Immunizations/screenings/ancillary studies- declines flu, covid Immunization History  Administered Date(s) Administered   Moderna Sars-Covid-2 Vaccination 01/27/2020, 02/29/2020   Tdap 01/10/2015  4. Prostate cancer screening- prostate cancer in father. But his PSA was very low- screen annually  Lab Results  Component Value Date   PSA 0.12 05/24/2024   5. Colon cancer screening - trying to get this done before the end of year- gave # again to call 6. Skin cancer screening/prevention- no dermatologist. advised regular sunscreen use. Denies worrisome, changing, or new skin lesions- other than spot in groin and right calf - skin tag on leg and right calf looks like postinflammatory hyperpigmentation and has bene stbale- he will let me know If changes  7. Testicular cancer screening- advised monthly self exams  8. STD screening- patient opts in- does use protection but wants to be sure 9. Smoking associated screening- never smoker  Status of chronic or acute concerns   #erectile dysfunction (ED) -maintaining main issue. Libido has dropped some- wants to check testosterone and interested in erectile dysfunction (ED) medications- will trial Viagra  -feels stress issues contributed  #right leg spot- small tag under left leg- does not bother him.   #nasal congestion- particularly in the mornings and takes time to clear out- has not tried allergy medicine - and also mentions breathing issues- has noted heavy breathing at rest at times from friends or partners. (Lungs clear today) feels more winded with activity. No chest pain with this.   - he is going to trial allergy medicine like zyrtec or claritin  or xyzal along with increasing exercise gradually. If nasal issues aren't improving or if shortness of breath doesn't improve or certainly if worsens- agrees to return to see me to get x-ray- wanted to hold off today  #Dysphagia- feels like food gets stuck starting a few years ago- was worsening then stabilized  #bilateral shoulder pain- years of issues- trouble going overhead or laying on it and particular. Has heard pop in right shoulder before.   #prior polyuria- resolved  Recommended follow up: Return in about 1 year (around 09/01/2025) for physical or sooner if needed.Schedule b4 you leave.  Lab/Order associations: fasting   ICD-10-CM   1. Preventative health care  Z00.00     2. Hyperlipidemia, unspecified hyperlipidemia type  E78.5  3. Hyperglycemia  R73.9 HgB A1c    4. Screening for diabetes mellitus  Z13.1 HgB A1c    5. Dysphagia, unspecified type  R13.10 Ambulatory referral to Gastroenterology    6. Screen for colon cancer  Z12.11 Ambulatory referral to Gastroenterology    7. Chronic pain of both shoulders  M25.511 Ambulatory referral to Sports Medicine   G89.29    M25.512     8. Screening for HIV (human immunodeficiency virus)  Z11.4 HIV Antibody (routine testing w rflx)    9. Screening examination for venereal disease  Z11.3 RPR    10. Screening for gonorrhea  Z11.3 Urine cytology ancillary only    11. Screening for chlamydial disease  Z11.8 Urine cytology ancillary only    12. Low libido  R68.82 Testosterone    13. Erectile dysfunction, unspecified erectile dysfunction type  N52.9 Testosterone    14. Obesity (BMI 30-39.9)  E66.9       Meds ordered this encounter  Medications   sildenafil (VIAGRA) 100 MG tablet    Sig: Take 1 tablet (100 mg total) by mouth daily as needed for erectile dysfunction.    Dispense:  10 tablet    Refill:  11    Return precautions advised.   Garnette Lukes, MD

## 2024-09-20 NOTE — Progress Notes (Unsigned)
 Ben Jackson D.[facility] [address] [place] [address] Phone: [phone]   Assessment and Plan:     1. Chronic pain of both shoulders (Primary) -Chronic with exacerbation, initial sports medicine visit - Bilateral shoulder pain for years without specific MOI.  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  My interpretation: No acute fracture or dislocation.  Mild degenerative changes at bilateral AC joint.  Small spur at the inferior humeral head bilaterally - Start meloxicam  15 mg daily x2 weeks.  If still having pain after 2 weeks, complete 3rd-week of NSAID. May use remaining NSAID as needed once daily for pain control.  Do not to use additional over-the-counter NSAIDs (ibuprofen, naproxen, Advil, Aleve, etc.) while taking prescription NSAIDs.  May use Tylenol [phone] mg 2 to 3 times a day for breakthrough pain. - Start HEP and physical therapy for bilateral shoulder, rotator cuff  15 additional minutes spent for educating Therapeutic Home Exercise Program.  This included exercises focusing on stretching, strengthening, with focus on eccentric aspects.   Long term goals include an improvement in range of motion, strength, endurance as well as avoiding reinjury. Patient's frequency would include in 1-2 times a day, 3-5 times a week for a duration of 6-12 weeks. Proper technique shown and discussed handout in great detail with ATC.  All questions were discussed and answered.      Pertinent previous records reviewed include none   Follow Up: 6 weeks for reevaluation.  Could consider subacromial CSI versus ultrasound versus MRI   Subjective:   I, Gee Habig, am serving as a neurosurgeon for Doctor Morene Mace  Chief Complaint: bilat shoulder pain  HPI:   09/21/2024 Patient is a 48 year old male with bilat shoulder pain. Patient  states hx of pain early this year pain increased. R shoulder lifting energy. L flag football was hit while throwing the ball. No meds for the pain. Decreased ROM. No radiating pain. Does endorse intermittent numbness and tingling. Grip strength intact.    Relevant Historical Information: GERD  Additional pertinent review of systems negative.   Current Outpatient Medications:    acetaminophen (TYLENOL) 325 MG tablet, Take 650 mg by mouth every 6 (six) hours as needed for moderate pain or headache., Disp: , Rfl:    Carbamide Peroxide (EAR DROPS OT), Place 3 drops into the left ear at bedtime as needed (ear pain)., Disp: , Rfl:    meloxicam  (MOBIC ) 15 MG tablet, Take 1 tablet daily for 2 weeks.  If still in pain after 2 weeks, take 1 tablet daily for an additional 1 week., Disp: 30 tablet, Rfl: 0   OVER THE COUNTER MEDICATION, Take 2 tablets by mouth daily. Vita fusion with b 12 gummy, Disp: , Rfl:    sildenafil (VIAGRA) 100 MG tablet, Take 1 tablet (100 mg total) by mouth daily as needed for erectile dysfunction., Disp: 10 tablet, Rfl: 11   Objective:     Vitals:   09/21/24 1019  BP: 120/76  Pulse: 81  SpO2: 97%  Weight: 224 lb (101.6 kg)  Height: 5' 9 (1.753 m)      Body mass index is 33.08 kg/m.    Physical Exam:    Gen: Appears well, nad, nontoxic and pleasant Neuro:sensation intact, strength is 5/5, muscle tone wnl Skin: no suspicious lesion or defmority Psych: A&O, appropriate mood and affect  Bilateral shoulder:  No deformity, swelling or muscle wasting No scapular winging FF 180, abd 180, int 0 on right/5 on left, ext 90 NTTP over the [place], clavicle, ac, coracoid, biceps groove, humerus, deltoid, trapezius, cervical spine Positive neer, hawkins,  obriens, Negative empty can, crossarm, subscap liftoff, speeds Neg ant drawer, sulcus sign, apprehension Negative Spurling's test bilat FROM of neck    Electronically signed by:  Odis Mace D[facility] 10:38 AM 09/21/24

## 2024-09-21 ENCOUNTER — Ambulatory Visit (INDEPENDENT_AMBULATORY_CARE_PROVIDER_SITE_OTHER): Admitting: Sports Medicine

## 2024-09-21 ENCOUNTER — Ambulatory Visit

## 2024-09-21 VITALS — BP 120/76 | HR 81 | Ht 69.0 in | Wt 224.0 lb

## 2024-09-21 DIAGNOSIS — M25511 Pain in right shoulder: Secondary | ICD-10-CM

## 2024-09-21 DIAGNOSIS — M25512 Pain in left shoulder: Secondary | ICD-10-CM | POA: Diagnosis not present

## 2024-09-21 DIAGNOSIS — G8929 Other chronic pain: Secondary | ICD-10-CM

## 2024-09-21 MED ORDER — MELOXICAM 15 MG PO TABS: ORAL_TABLET | ORAL | 0 refills | Status: AC

## 2024-09-21 NOTE — Patient Instructions (Signed)
-   Start meloxicam  15 mg daily x2 weeks.  If still having pain after 2 weeks, complete 3rd-week of NSAID. May use remaining NSAID as needed once daily for pain control.  Do not to use additional over-the-counter NSAIDs (ibuprofen, naproxen, Advil, Aleve, etc.) while taking prescription NSAIDs.  May use Tylenol [phone] mg 2 to 3 times a day for breakthrough pain.  Shoulder HEP   PT referral   6 week follow up

## 2024-09-24 ENCOUNTER — Ambulatory Visit: Payer: Self-pay | Admitting: Sports Medicine

## 2024-11-08 ENCOUNTER — Ambulatory Visit: Admitting: Sports Medicine

## 2025-09-07 ENCOUNTER — Encounter: Admitting: Family Medicine
